# Patient Record
Sex: Female | Born: 1980 | Race: Black or African American | Hispanic: No | Marital: Married | Smoking: Never smoker
Health system: Southern US, Community
[De-identification: ages and names within clinical notes are randomized; demographics above are authoritative.]

## PROBLEM LIST (undated history)

## (undated) DIAGNOSIS — E079 Disorder of thyroid, unspecified: Secondary | ICD-10-CM

## (undated) DIAGNOSIS — E119 Type 2 diabetes mellitus without complications: Secondary | ICD-10-CM

## (undated) DIAGNOSIS — J45909 Unspecified asthma, uncomplicated: Secondary | ICD-10-CM

## (undated) HISTORY — PX: HERNIA REPAIR: SHX51

## (undated) HISTORY — PX: LASIK: SHX215

## (undated) HISTORY — DX: Unspecified asthma, uncomplicated: J45.909

## (undated) HISTORY — DX: Type 2 diabetes mellitus without complications: E11.9

## (undated) HISTORY — PX: BREAST SURGERY: SHX581

## (undated) HISTORY — DX: Disorder of thyroid, unspecified: E07.9

---

## 2016-09-15 DIAGNOSIS — E032 Hypothyroidism due to medicaments and other exogenous substances: Secondary | ICD-10-CM | POA: Insufficient documentation

## 2016-09-15 DIAGNOSIS — K600 Acute anal fissure: Secondary | ICD-10-CM | POA: Insufficient documentation

## 2016-12-18 DIAGNOSIS — L84 Corns and callosities: Secondary | ICD-10-CM | POA: Insufficient documentation

## 2017-02-17 DIAGNOSIS — N61 Mastitis without abscess: Secondary | ICD-10-CM | POA: Insufficient documentation

## 2017-02-17 DIAGNOSIS — B354 Tinea corporis: Secondary | ICD-10-CM | POA: Insufficient documentation

## 2017-03-09 DIAGNOSIS — L603 Nail dystrophy: Secondary | ICD-10-CM | POA: Insufficient documentation

## 2018-07-19 DIAGNOSIS — G8929 Other chronic pain: Secondary | ICD-10-CM | POA: Insufficient documentation

## 2018-07-19 DIAGNOSIS — J4520 Mild intermittent asthma, uncomplicated: Secondary | ICD-10-CM | POA: Insufficient documentation

## 2018-08-06 DIAGNOSIS — D500 Iron deficiency anemia secondary to blood loss (chronic): Secondary | ICD-10-CM | POA: Insufficient documentation

## 2018-11-19 LAB — CBC AND DIFFERENTIAL
HCT: 35 — AB (ref 36–46)
Hemoglobin: 11.4 — AB (ref 12.0–16.0)
PLATELETS: 264 (ref 150–399)
WBC: 5

## 2018-12-03 ENCOUNTER — Other Ambulatory Visit: Payer: Self-pay

## 2018-12-14 ENCOUNTER — Encounter: Payer: Self-pay | Admitting: Certified Nurse Midwife

## 2018-12-14 LAB — HM PAP SMEAR

## 2018-12-15 ENCOUNTER — Encounter: Payer: Self-pay | Admitting: Certified Nurse Midwife

## 2018-12-15 ENCOUNTER — Ambulatory Visit (INDEPENDENT_AMBULATORY_CARE_PROVIDER_SITE_OTHER): Admitting: Certified Nurse Midwife

## 2018-12-15 VITALS — BP 129/80 | HR 64 | Ht 64.0 in | Wt 171.1 lb

## 2018-12-15 DIAGNOSIS — Z3169 Encounter for other general counseling and advice on procreation: Secondary | ICD-10-CM | POA: Diagnosis not present

## 2018-12-15 NOTE — Patient Instructions (Signed)
Female Infertility    Female infertility refers to a woman's inability to get pregnant (conceive) after a year of having sex regularly (or after 6 months in women over age 40) without using birth control. Infertility can also mean that a woman is not able to carry a pregnancy to full term.  Both women and men can have fertility problems.  What are the causes?  This condition may be caused by:  Problems with reproductive organs. Infertility can result if a woman:  Has an abnormally short cervix or a cervix that does not remain closed during a pregnancy.  Has a blockage or scarring in the fallopian tubes.  Has an abnormally shaped uterus.  Has uterine fibroids. This is a benign mass of tissue or muscle (tumor) that can develop in the uterus.  Is not ovulating in a regular way.  Certain medical conditions. These may include:  Polycystic ovary syndrome (PCOS). This is a hormonal disorder that can cause small cysts to grow on the ovaries. This is the most common cause of infertility in women.  Endometriosis. This is a condition in which the tissue that lines the uterus (endometrium) grows outside of its normal location.  Cancer and cancer treatments, such as chemotherapy or radiation.  Premature ovarian failure. This is when ovaries stop producing eggs and hormones before age 40.  Sexually transmitted diseases, such as chlamydia or gonorrhea.  Autoimmune disorders. These are disorders in which the body's defense system (immune system) attacks normal, healthy cells.  Infertility can be linked to more than one cause. For some women, the cause of infertility is not known (unexplained infertility).  What increases the risk?  Age. A woman's fertility declines with age, especially after her mid-30s.  Being underweight or overweight.  Drinking too much alcohol.  Using drugs such as anabolic steroids, cocaine, and marijuana.  Exercising excessively.  Being exposed to environmental toxins, such as radiation, pesticides, and  certain chemicals.  What are the signs or symptoms?  The main sign of infertility in women is the inability to get pregnant or carry a pregnancy to full term.  How is this diagnosed?  This condition may be diagnosed by:  Checking whether you are ovulating each month. The tests may include:  Blood tests to check hormone levels.  An ultrasound of the ovaries.  Taking a small tissue that lines the uterus and checking it under a microscope (endometrial biopsy).  Doing additional tests. This is done if ovulation is normal. Tests may include:  Hysterosalpingography. This X-ray test can show the shape of the uterus and whether the fallopian tubes are open.  Laparoscopy. This test uses a lighted tube (laparoscope) to look for problems in the fallopian tubes and other organs.  Transvaginal ultrasound. This imaging test is used to check for abnormalities in the uterus and ovaries.  Hysteroscopy. This test uses a lighted tube to check for problems in the cervix and the uterus.  To be diagnosed with infertility, both partners will have a physical exam. Both partners will also have an extensive medical and sexual history taken. Additional tests may be done.  How is this treated?  Treatment depends on the cause of infertility. Most cases of infertility in women are treated with medicine or surgery.  Women may take medicine to:  Correct ovulation problems.  Treat other health conditions.  Surgery may be done to:  Repair damage to the ovaries, fallopian tubes, cervix, or uterus.  Remove growths from the uterus.  Remove scar tissue   from the uterus, pelvis, or other organs.  Assisted reproductive technology (ART)  Assisted reproductive technology (ART) refers to all treatments and procedures that combine eggs and sperm outside the body to try to help a couple conceive. ART is often combined with fertility drugs to stimulate ovulation. Sometimes ART is done using eggs retrieved from another woman's body (donor eggs) or from previously  frozen fertilized eggs (embryos).  There are different types of ART. These include:  Intrauterine insemination (IUI). A long, thin tube is used to place sperm directly into a woman's uterus. This procedure:  Is effective for infertility caused by sperm problems, including low sperm count and low motility.  Can be used in combination with fertility drugs.  In vitro fertilization (IVF). This is done when a woman's fallopian tubes are blocked or when a man has low sperm count. In this procedure:  Fertility drugs are used to stimulate the ovaries to produce multiple eggs.  Once mature, these eggs are removed from the body and combined with the sperm to be fertilized.  The fertilized eggs are then placed into the woman's uterus.  Follow these instructions at home:  Take over-the-counter and prescription medicines only as told by your health care provider.  Do not use any products that contain nicotine or tobacco, such as cigarettes and e-cigarettes. If you need help quitting, ask your health care provider.  If you drink alcohol, limit how much you have to 1 drink a day.  Make dietary changes to lose weight or maintain a healthy weight. Work with your health care provider and a dietitian to set a weight-loss goal that is healthy and reasonable for you.  Seek support from a counselor or support group to talk about your concerns related to infertility. Couples counseling may be helpful for you and your partner.  Practice stress reduction techniques that work well for you, such as regular physical activity, meditation, or deep breathing.  Keep all follow-up visits as told by your health care provider. This is important.  Contact a health care provider if you:  Feel that stress is interfering with your life and relationships.  Have side effects from treatments for infertility.  Summary  Female infertility refers to a woman's inability to get pregnant (conceive) after a year of having sex regularly (or after 6 months in women  over age 40) without using birth control.  To be diagnosed with infertility, both partners will have a physical exam. Both partners will also have an extensive medical and sexual history taken.  Seek support from a counselor or support group to talk about your concerns related to infertility. Couples counseling may be helpful for you and your partner.  This information is not intended to replace advice given to you by your health care provider. Make sure you discuss any questions you have with your health care provider.  Document Released: 11/20/2003 Document Revised: 10/19/2017 Document Reviewed: 10/19/2017  Elsevier Interactive Patient Education © 2019 [employer]  •

## 2018-12-15 NOTE — Progress Notes (Signed)
Subjective:    Jessica Knight is a 38 y.o. female who presents for evaluation of infertility. Patient and partner have been attempting conception for 7 years. She has history of IVF x 1 that was unsuccessful. She states that she has had a complete work up with u/s and blood work completed at infertility clinic in [place]. Her partner had a semen analysis and was found to have low sperm count. He had procedure done to help improve his count with no success. Pregnancies with current partner: no.   Menstrual and Endocrine History LMP Patient's last menstrual period was 11/19/2018 (exact date).  Menarche 16  Shortest interval 28  Longest interval 60  days  Duration of flow 5 days  Heavy menses For 2 days  Clots no  Intermenstrual bleeding no  Postcoital bleeding no  Dysmenorrhea no  Amenorrhea yes for 2 cycles  Weight change no  Hirsutism yes   Balding no  Acne with cycles      Obstetrical History Never pregnant  Gynecologic History Last PAP 08/11/18  Previous abdominal or pelvic surgery no  Pelvic pain no  Endometriosis no  Hot flashes no  DES exposure no  Abnormal Pap no  Cervix Cryo/cone no  Sexually transmitted diseases yes, HSV  Pelvic inflammatory disease no   Infertility and Endocrine Studies Basal body temperature no  Endo with biopsy unsure  Hysterosalpingogram she is unsure  Post-coital test she is unsure  Laparoscopy she is unsure  Hormonal studies yes  Semen analysis yes  Other studies yes  Medications IVF  Other therapies IVF, 1 cycles  Insemination not applicable     Family History Thyroid problems  no  Heart condition or high blood pressure  no  Blood clot or stroke  no  Diabetes  yes  Cancer  no  Birth defects/inherited diseases  no  Infectious diseases (mumps, TB, rubella)  no  Other medical problems  no   Habits Cigarettes:    Wife -  no    Husband - no Alcohol:    Wife -  yes, 1-2 times week    Husband - socially  Marijuana:  Wife - no   Husband - no  The following portions of the patient's history were reviewed and updated as appropriate: allergies, current medications, past family history, past medical history, past social history, past surgical history and problem list.  Review of Systems Pertinent items are noted in HPI.   Paternity of Pregnancies: Number with this partner: 0 Number with other partners: 0 Age of youngest child: not applicable  Urologic History: Infection no  STD yes, HSV  Mumps unknown  Varicocele unknown  Semen analysis yes, low sperm count  Undescended testes unknown  Testicular trauma unknown  Genital surgery yes  Ejaculatory problem unknown  Impotence unknown      Objective:    Female Exam BP 129/80   Pulse 64   Ht 5\' 4"  (1.626 m)   Wt 171 lb 1 oz (77.6 kg)   LMP 11/19/2018 (Exact Date)   BMI 29.36 kg/m  Wt Readings from Last 1 Encounters:  12/15/18 171 lb 1 oz (77.6 kg)   BMI: Body mass index is 29.36 kg/m. No exam performed today, not indicated, referral to infertility specialist .  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   Plan:     referral placed for [facility] due to patients exensive history and age. Pt request to see [facility].  I attest more than 50% of this visit spent reviewing patient history, discussing past procedures and testing completed , and developing a plan of care. Face to face time 10 min.   Doreene Burke, CNM

## 2019-01-25 ENCOUNTER — Telehealth: Payer: Self-pay

## 2019-01-25 ENCOUNTER — Telehealth: Payer: Self-pay | Admitting: Certified Nurse Midwife

## 2019-01-25 NOTE — Telephone Encounter (Signed)
The patient called and stated that she never heard anything back in regards to her last apt w/ Pattricia Boss. The patient stated that she was informed that she would get a call back. Please advise.

## 2019-01-25 NOTE — Telephone Encounter (Signed)
mychart message sent

## 2019-03-08 DIAGNOSIS — Z3181 Encounter for male factor infertility in female patient: Secondary | ICD-10-CM | POA: Insufficient documentation

## 2019-05-22 ENCOUNTER — Encounter: Payer: Self-pay | Admitting: Emergency Medicine

## 2019-05-22 ENCOUNTER — Emergency Department

## 2019-05-22 ENCOUNTER — Emergency Department
Admission: EM | Admit: 2019-05-22 | Discharge: 2019-05-22 | Disposition: A | Discharge status: Home / Self Care | Attending: Emergency Medicine | Admitting: Emergency Medicine

## 2019-05-22 ENCOUNTER — Other Ambulatory Visit: Payer: Self-pay

## 2019-05-22 DIAGNOSIS — R0781 Pleurodynia: Secondary | ICD-10-CM | POA: Insufficient documentation

## 2019-05-22 DIAGNOSIS — Z79899 Other long term (current) drug therapy: Secondary | ICD-10-CM | POA: Insufficient documentation

## 2019-05-22 DIAGNOSIS — J45909 Unspecified asthma, uncomplicated: Secondary | ICD-10-CM | POA: Insufficient documentation

## 2019-05-22 DIAGNOSIS — R079 Chest pain, unspecified: Secondary | ICD-10-CM | POA: Diagnosis present

## 2019-05-22 DIAGNOSIS — R059 Cough, unspecified: Secondary | ICD-10-CM

## 2019-05-22 DIAGNOSIS — R05 Cough: Secondary | ICD-10-CM

## 2019-05-22 LAB — CBC WITH DIFFERENTIAL/PLATELET
Abs Immature Granulocytes: 0.01 10*3/uL (ref 0.00–0.07)
Basophils Absolute: 0 10*3/uL (ref 0.0–0.1)
Basophils Relative: 1 %
Eosinophils Absolute: 0.2 10*3/uL (ref 0.0–0.5)
Eosinophils Relative: 4 %
HCT: 39.3 % (ref 36.0–46.0)
Hemoglobin: 12.6 g/dL (ref 12.0–15.0)
Immature Granulocytes: 0 %
Lymphocytes Relative: 49 %
Lymphs Abs: 2.7 10*3/uL (ref 0.7–4.0)
MCH: 27.8 pg (ref 26.0–34.0)
MCHC: 32.1 g/dL (ref 30.0–36.0)
MCV: 86.8 fL (ref 80.0–100.0)
Monocytes Absolute: 0.3 10*3/uL (ref 0.1–1.0)
Monocytes Relative: 5 %
Neutro Abs: 2.3 10*3/uL (ref 1.7–7.7)
Neutrophils Relative %: 41 %
Platelets: 308 10*3/uL (ref 150–400)
RBC: 4.53 MIL/uL (ref 3.87–5.11)
RDW: 13.7 % (ref 11.5–15.5)
WBC: 5.5 10*3/uL (ref 4.0–10.5)
nRBC: 0 % (ref 0.0–0.2)

## 2019-05-22 LAB — COMPREHENSIVE METABOLIC PANEL
ALT: 14 U/L (ref 0–44)
AST: 21 U/L (ref 15–41)
Albumin: 4.1 g/dL (ref 3.5–5.0)
Alkaline Phosphatase: 65 U/L (ref 38–126)
Anion gap: 6 (ref 5–15)
BUN: 13 mg/dL (ref 6–20)
CO2: 25 mmol/L (ref 22–32)
Calcium: 9.2 mg/dL (ref 8.9–10.3)
Chloride: 105 mmol/L (ref 98–111)
Creatinine, Ser: 0.79 mg/dL (ref 0.44–1.00)
GFR calc Af Amer: >60 mL/min (ref >60)
GFR calc non Af Amer: >60 mL/min (ref >60)
Glucose, Bld: 98 mg/dL (ref 70–99)
Potassium: 4 mmol/L (ref 3.5–5.1)
Sodium: 136 mmol/L (ref 135–145)
Total Bilirubin: 0.6 mg/dL (ref 0.3–1.2)
Total Protein: 7.6 g/dL (ref 6.5–8.1)

## 2019-05-22 LAB — FIBRIN DERIVATIVES D-DIMER (ARMC ONLY): Fibrin derivatives D-dimer (ARMC): 223.84 ng/mL (FEU) (ref 0.00–499.00)

## 2019-05-22 LAB — TROPONIN I: Troponin I: <0.03 ng/mL (ref <0.03)

## 2019-05-22 LAB — POCT PREGNANCY, URINE: Preg Test, Ur: NEGATIVE

## 2019-05-22 MED ORDER — IOHEXOL 350 MG/ML SOLN
75.0000 mL | Freq: Once | INTRAVENOUS | Status: AC | PRN
  Administered 2019-05-22: 75 mL via INTRAVENOUS

## 2019-05-22 MED ORDER — HYDROMORPHONE HCL 1 MG/ML IJ SOLN
0.5000 mg | Freq: Once | INTRAMUSCULAR | Status: AC
  Administered 2019-05-22: 0.5 mg via INTRAVENOUS
  Filled 2019-05-22: qty 1

## 2019-05-22 MED ORDER — ONDANSETRON HCL 4 MG/2ML IJ SOLN
4.0000 mg | Freq: Once | INTRAMUSCULAR | Status: AC
  Administered 2019-05-22: 4 mg via INTRAVENOUS
  Filled 2019-05-22: qty 2

## 2019-05-22 MED ORDER — SODIUM CHLORIDE 0.9% FLUSH
3.0000 mL | Freq: Once | INTRAVENOUS | Status: AC
  Administered 2019-05-22: 3 mL via INTRAVENOUS

## 2019-05-22 MED ORDER — SODIUM CHLORIDE 0.9 % IV SOLN
Freq: Once | INTRAVENOUS | Status: AC
  Administered 2019-05-22: 11:00:00 via INTRAVENOUS

## 2019-05-22 MED ORDER — KETOROLAC TROMETHAMINE 30 MG/ML IJ SOLN
30.0000 mg | Freq: Once | INTRAMUSCULAR | Status: AC
  Administered 2019-05-22: 30 mg via INTRAVENOUS
  Filled 2019-05-22: qty 1

## 2019-05-22 NOTE — ED Notes (Signed)
Pt taken to CT at this time.

## 2019-05-22 NOTE — ED Notes (Signed)
EDP at bedside at this time to update patient. Pt instructed to call for her ride at this time, informed her of 3min med hold after administration of IV medication. Pt states understanding. Will continue to monitor for further patient needs and D/C when patient's ride arrives.

## 2019-05-22 NOTE — ED Triage Notes (Signed)
C/O left chest pain, just under breast.  States pain started last night and worsened today.  Pain worse with C+ DB+ movement.

## 2019-05-22 NOTE — Discharge Instructions (Signed)
Please use Motrin 3 or 4 the over-the-counter pills 3 times a day for pain.  Please follow-up with your regular doctor this coming week.  Have them review your CT report.  They may want to refer you to pulmonary medicine.  Please return for increasing pain shortness of breath fever or any other complaints.

## 2019-05-22 NOTE — ED Notes (Signed)
Pt assisted to the bathroom by this RN.  

## 2019-05-22 NOTE — ED Notes (Signed)
EDP at bedside at this time.  

## 2019-05-22 NOTE — ED Provider Notes (Signed)
[facility] Emergency Department Provider Note   ____________________________________________   First MD Initiated Contact with Patient 05/22/19 1044     (approximate)  I have reviewed the triage vital signs and the nursing notes.   HISTORY  Chief Complaint Chest Pain    HPI Jessica Knight is a [age] y.o. female who complains of sharp pleuritic pain under the left breast onset suddenly last night.  No fever or chills.  She is not short of breath but the pain is much worse with movement or deep breathing.  She is not had this before.  Pain is severe.  Patient has a history of asthma.  Has no fever no cough.         Past Medical History:  Diagnosis Date   Asthma    Thyroid disease     There are no active problems to display for this patient.   Past Surgical History:  Procedure Laterality Date   BREAST SURGERY     HERNIA REPAIR     LASIK      Prior to Admission medications   Medication Sig Start Date End Date Taking? Authorizing Provider  cetirizine (ZYRTEC) 10 MG tablet Take 10 mg by mouth daily. 02/16/19  Yes [provider]  fluticasone (FLONASE) 50 MCG/ACT nasal spray Place 1 spray into both nostrils daily. 02/15/19  Yes [provider]  levothyroxine (SYNTHROID, LEVOTHROID) 112 MCG tablet Take 112 mcg by mouth daily before breakfast.  11/15/18  Yes [provider]  Olopatadine HCl 0.2 % SOLN Place 1 drop into both eyes daily. 02/15/19  Yes [provider]  sodium chloride (OCEAN) 0.65 % nasal spray Place 1 spray into the nose as needed. 02/15/19 02/15/20 Yes [provider]  Albuterol Sulfate 108 (90 Base) MCG/ACT AEPB Inhale into the lungs.    [provider]  meloxicam (MOBIC) 15 MG tablet Take 15 mg by mouth daily.  07/19/18   [provider]  tiZANidine (ZANAFLEX) 4 MG tablet Take 4 mg by mouth at bedtime.  11/19/18 11/19/19  [provider]    Allergies Patient  has no known allergies.  Family History  Problem Relation Age of Onset   Breast cancer Maternal Aunt    Diabetes Maternal Grandmother     Social History Social History   Tobacco Use   Smoking status: Never Smoker   Smokeless tobacco: Never Used  Substance Use Topics   Alcohol use: Yes    Comment: occas   Drug use: Not Currently    Review of Systems  Constitutional: No fever/chills Eyes: No visual changes. ENT: No sore throat. Cardiovascular: Denies chest pain. Respiratory: Denies shortness of breath. Gastrointestinal: No abdominal pain.  No nausea, no vomiting.  No diarrhea.  No constipation. Genitourinary: Negative for dysuria. Musculoskeletal: Negative for back pain. Skin: Negative for rash. Neurological: Negative for headaches, focal weakness  ____________________________________________   PHYSICAL EXAM:  VITAL SIGNS: ED Triage Vitals  Enc Vitals Group     BP 05/22/19 1024 114/82     Pulse Rate 05/22/19 1024 71     Resp 05/22/19 1024 20     Temp 05/22/19 1024 98.8 F (37.1 C)     Temp Source 05/22/19 1024 Oral     SpO2 05/22/19 1024 100 %     Weight 05/22/19 1022 171 lb 1.2 oz (77.6 kg)     Height 05/22/19 1022 5\' 4"  (1.626 m)     Head Circumference --      Peak  Flow --      Pain Score 05/22/19 1021 8     Pain Loc --      Pain Edu? --      Excl. in [place]? --     Constitutional: Alert and oriented.  in acute distress. Eyes: Conjunctivae are normal. Head: Atraumatic. Nose: No congestion/rhinnorhea. Mouth/Throat: Mucous membranes are moist.  Oropharynx non-erythematous. Neck: No stridor.  Cardiovascular: Normal rate, regular rhythm. Grossly normal heart sounds.  Good peripheral circulation. Respiratory: Normal respiratory effort.  No retractions. Lungs CTAB. Gastrointestinal: Soft and nontender. No distention. No abdominal bruits. No CVA tenderness. Musculoskeletal: No lower extremity tenderness nor edema.   Neurologic:  Normal speech and  language. No gross focal neurologic deficits are appreciated Skin:  Skin is warm, dry and intact. No rash noted.   ____________________________________________   LABS (all labs ordered are listed, but only abnormal results are displayed)  Labs Reviewed  TROPONIN I  CBC WITH DIFFERENTIAL/PLATELET  COMPREHENSIVE METABOLIC PANEL  FIBRIN DERIVATIVES D-DIMER ([facility] ONLY)  POCT PREGNANCY, URINE  POC URINE PREG, ED   ____________________________________________  EKG  EKG read interpreted by me shows normal sinus rhythm rate of 63 normal axis there are flipped T waves inferiorly and ST flattening in the precordial leads.  EKG looks somewhat different than previously. ____________________________________________  RADIOLOGY  ED MD interpretation: Chest x-ray read by radiology reviewed by me.  Radiology says no acute disease.  There may be on my interpretation is small line of air around the heart.  This may be a pneumopericardium.  We will get a CT to find out. CT read by radiology reviewed by me shows 2 nodules and some haziness throughout which could be postinfectious or caused by a number of other problems which radiologist has listed.  I reviewed the films. Official radiology report(s): Ct Angio Chest Pe W And/or Wo Contrast  Result Date: 05/22/2019 CLINICAL DATA:  Chest pain for 2 days. EXAM: CT ANGIOGRAPHY CHEST WITH CONTRAST TECHNIQUE: Multidetector CT imaging of the chest was performed using the standard protocol during bolus administration of intravenous contrast. Multiplanar CT image reconstructions and MIPs were obtained to evaluate the vascular anatomy. CONTRAST:  53mL OMNIPAQUE IOHEXOL 350 MG/ML SOLN COMPARISON:  None. FINDINGS: Cardiovascular: The heart is normal in size. No pericardial effusion. The aorta is normal in caliber. No dissection. The pulmonary arteries are mildly enlarged which may suggest pulmonary hypertension. No filling defects are identified to suggest pulmonary  embolism. Mediastinum/Nodes: No mediastinal or hilar mass or lymphadenopathy. The esophagus is grossly normal. Lungs/Pleura: Patchy mosaic pattern of attenuation in the lungs. There are areas of markedly decreased attenuation and paucity of vascularity. This can be seen with both vascular causes and small airways disease. This could be due to constrictive bronchiolitis, reactive airways disease/asthma or bronchiolitis obliterans. Other possibilities would include hypersensitivity pneumonitis and vascular processes such as vasculitis or chronic PE. No pulmonary infiltrates, pulmonary edema or pleural effusions. There is a 7.5 mm nodular density in the left upper lobe just above the major fissure which is indeterminate. There is also 5 mm nodule in the left upper lobe on image number 27. Upper Abdomen: No significant upper abdominal findings. Musculoskeletal: No breast masses, supraclavicular or axillary adenopathy. The bony thorax is unremarkable. No bone lesions or fractures. Review of the MIP images confirms the above findings. IMPRESSION: 1. No CT findings for pulmonary embolism. 2. Normal thoracic aorta. 3. Enlarged pulmonary artery suggesting pulmonary hypertension. 4. No mediastinal or hilar mass or adenopathy. 5. Patchy  mosaic pattern of attenuation in the lungs with areas of marked low-attenuation and hypo vascularity most likely due to small airways disease such as constrictive bronchiolitis, respiratory bronchiolitis, reactive airways disease/asthma. Post infectious processes could also have this appearance along with hypersensitivity pneumonitis and vascular disease. 6. Two left upper lobe pulmonary nodules as detailed above. Recommend follow-up noncontrast chest CT in 6 months to reassess. 7. No significant upper abdominal findings. Electronically Signed   By: Rudie MeyerP.  Gallerani M.D.   On: 05/22/2019 12:24   Dg Chest Port 1 View  Result Date: 05/22/2019 CLINICAL DATA:  Cough EXAM: PORTABLE CHEST 1 VIEW  COMPARISON:  None. FINDINGS: Normal heart size. Normal mediastinal contour. No pneumothorax. No pleural effusion. Lungs appear clear, with no acute consolidative airspace disease and no pulmonary edema. IMPRESSION: No active disease. Electronically Signed   By: Delbert PhenixJason A Poff M.D.   On: 05/22/2019 11:20    ____________________________________________   PROCEDURES  Procedure(s) performed (including Critical Care):  Procedures   ____________________________________________   INITIAL IMPRESSION / ASSESSMENT AND PLAN / ED COURSE  Jessica Savannahattiqua Bonura was evaluated in Emergency Department on 05/22/2019 for the symptoms described in the history of present illness. She was evaluated in the context of the global COVID-19 pandemic, which necessitated consideration that the patient might be at risk for infection with the SARS-CoV-2 virus that causes COVID-19. Institutional protocols and algorithms that pertain to the evaluation of patients at risk for COVID-19 are in a state of rapid change based on information released by regulatory bodies including the CDC and federal and state organizations. These policies and algorithms were followed during the patient's care in the ED.   Patient with no evidence of PE or pericardial air or any other problems.  CT does show some          ____________________________________________   FINAL CLINICAL IMPRESSION(S) / ED DIAGNOSES  Final diagnoses:  Pleuritic chest pain     ED Discharge Orders    None       Note:  This document was prepared using Dragon voice recognition software and may include unintentional dictation errors.    Arnaldo NatalMalinda, Travanti Mcmanus F, MD 05/22/19 [phone]

## 2019-05-22 NOTE — ED Notes (Signed)
NAD Noted at time of D/C. Pt taken to lobby via wheelchair by this RN. D/C into the care of her friend. Pt denies comments/concerns at this time. Fall precautions reviewed with patient at this time. E-sig obtained via paper copy by this RN.

## 2019-05-23 DIAGNOSIS — R918 Other nonspecific abnormal finding of lung field: Secondary | ICD-10-CM | POA: Insufficient documentation

## 2019-05-31 ENCOUNTER — Other Ambulatory Visit: Payer: Self-pay | Admitting: Physician Assistant

## 2019-05-31 DIAGNOSIS — N644 Mastodynia: Secondary | ICD-10-CM

## 2019-06-08 ENCOUNTER — Ambulatory Visit
Admission: RE | Admit: 2019-06-08 | Discharge: 2019-06-08 | Disposition: A | Discharge status: Home / Self Care | Source: Ambulatory Visit | Attending: Physician Assistant | Admitting: Physician Assistant

## 2019-06-08 ENCOUNTER — Ambulatory Visit

## 2019-06-08 ENCOUNTER — Other Ambulatory Visit: Payer: Self-pay

## 2019-06-08 DIAGNOSIS — N644 Mastodynia: Secondary | ICD-10-CM

## 2019-07-30 ENCOUNTER — Emergency Department
Admission: EM | Admit: 2019-07-30 | Discharge: 2019-07-30 | Disposition: A | Discharge status: Home / Self Care | Attending: Emergency Medicine | Admitting: Emergency Medicine

## 2019-07-30 ENCOUNTER — Other Ambulatory Visit: Payer: Self-pay

## 2019-07-30 ENCOUNTER — Emergency Department

## 2019-07-30 ENCOUNTER — Encounter: Payer: Self-pay | Admitting: Emergency Medicine

## 2019-07-30 DIAGNOSIS — J45909 Unspecified asthma, uncomplicated: Secondary | ICD-10-CM | POA: Diagnosis not present

## 2019-07-30 DIAGNOSIS — Y939 Activity, unspecified: Secondary | ICD-10-CM | POA: Insufficient documentation

## 2019-07-30 DIAGNOSIS — Y92410 Unspecified street and highway as the place of occurrence of the external cause: Secondary | ICD-10-CM | POA: Insufficient documentation

## 2019-07-30 DIAGNOSIS — H9201 Otalgia, right ear: Secondary | ICD-10-CM | POA: Insufficient documentation

## 2019-07-30 DIAGNOSIS — H6991 Unspecified Eustachian tube disorder, right ear: Secondary | ICD-10-CM | POA: Insufficient documentation

## 2019-07-30 DIAGNOSIS — Z79899 Other long term (current) drug therapy: Secondary | ICD-10-CM | POA: Diagnosis not present

## 2019-07-30 DIAGNOSIS — G501 Atypical facial pain: Secondary | ICD-10-CM | POA: Diagnosis present

## 2019-07-30 DIAGNOSIS — J341 Cyst and mucocele of nose and nasal sinus: Secondary | ICD-10-CM | POA: Insufficient documentation

## 2019-07-30 DIAGNOSIS — Y999 Unspecified external cause status: Secondary | ICD-10-CM | POA: Diagnosis not present

## 2019-07-30 MED ORDER — TRAMADOL HCL 50 MG PO TABS: 50.0000 mg | ORAL_TABLET | Freq: Four times a day (QID) | ORAL | 0 refills | Status: DC | PRN

## 2019-07-30 MED ORDER — METHYLPREDNISOLONE 4 MG PO TBPK: ORAL_TABLET | ORAL | 0 refills | Status: DC

## 2019-07-30 MED ORDER — NEOMYCIN-POLYMYXIN-HC 3.5-10000-1 OT SOLN: 4.0000 [drp] | Freq: Three times a day (TID) | OTIC | 0 refills | Status: AC

## 2019-07-30 NOTE — Discharge Instructions (Signed)
Follow discharge care instructions and follow-up with ENT for definitive evaluation and treatment.

## 2019-07-30 NOTE — ED Notes (Signed)
Patient transported to CT 
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
## 2019-11-18 ENCOUNTER — Encounter: Attending: Certified Nurse Midwife | Admitting: *Deleted

## 2019-11-18 ENCOUNTER — Other Ambulatory Visit: Payer: Self-pay

## 2019-11-18 ENCOUNTER — Encounter: Payer: Self-pay | Admitting: *Deleted

## 2019-11-18 VITALS — BP 110/78 | Ht 64.0 in | Wt 186.9 lb

## 2019-11-18 DIAGNOSIS — E119 Type 2 diabetes mellitus without complications: Secondary | ICD-10-CM | POA: Diagnosis not present

## 2019-11-18 DIAGNOSIS — O24119 Pre-existing diabetes mellitus, type 2, in pregnancy, unspecified trimester: Secondary | ICD-10-CM | POA: Insufficient documentation

## 2019-11-18 DIAGNOSIS — Z713 Dietary counseling and surveillance: Secondary | ICD-10-CM | POA: Insufficient documentation

## 2019-11-18 NOTE — Progress Notes (Signed)
Diabetes Self-Management Education  Visit Type: First/Initial  Appt. Start Time: 1045 Appt. End Time: 1215  11/18/2019  [name], identified by name and date of birth, is a 38 y.o. female with a diagnosis of Diabetes: Type 2(Pregnant).   ASSESSMENT  Blood pressure 110/78, height 5\' 4"  (1.626 m), weight 186 lb 14.4 oz (84.8 kg). last menstrual period - IVF; estimated date of delivery 05/27/2020 Body mass index is 32.08 kg/m.  Diabetes Self-Management Education - 11/18/19 1339      Visit Information   Visit Type  First/Initial      Initial Visit   Diabetes Type  Type 2   Pregnant   Are you currently following a meal plan?  No    Are you taking your medications as prescribed?  Yes    Date Diagnosed  December 2020      Health Coping   How would you rate your overall health?  Good      Psychosocial Assessment   Patient Belief/Attitude about Diabetes  Motivated to manage diabetes    Self-care barriers  None    Self-management support  Doctor's office;Family    Patient Concerns  Nutrition/Meal planning;Weight Control;Glycemic Control    Special Needs  None    Preferred Learning Style  Visual    Learning Readiness  Change in progress    How often do you need to have someone help you when you read instructions, pamphlets, or other written materials from your doctor or pharmacy?  1 - Never    What is the last grade level you completed in school?  MBA      Pre-Education Assessment   Patient understands the diabetes disease and treatment process.  Needs Instruction    Patient understands incorporating nutritional management into lifestyle.  Needs Instruction    Patient undertands incorporating physical activity into lifestyle.  Needs Instruction    Patient understands using medications safely.  Needs Instruction    Patient understands monitoring blood glucose, interpreting and using results  Needs Instruction    Patient understands prevention, detection, and treatment  of acute complications.  Needs Instruction    Patient understands prevention, detection, and treatment of chronic complications.  Needs Instruction    Patient understands how to develop strategies to address psychosocial issues.  Needs Instruction    Patient understands how to develop strategies to promote health/change behavior.  Needs Instruction      Complications   Last HgB A1C per patient/outside source  6.5 %   11/07/2019   How often do you check your blood sugar?  0 times/day (not testing)   Due to her insurance, she will require the FreeStyle meter and we only have those for demonstration. Instructed her on use. BG in the office on our glucometer was 85 mg/dL at 14/06/2019 pm - 4 1/2 hrs pp.   Have you had a dilated eye exam in the past 12 months?  Yes    Have you had a dental exam in the past 12 months?  Yes    Are you checking your feet?  No      Dietary Intake   Breakfast  oatmeal flax seed, berries; egg white with spinach, peppers and 1 piece of whole wheat toast    Snack (morning)  snacks every2 hours - 1/2 banana and almonds; crackers and nut butter with almond milk; fruit (cantaloupe, kiwi, oranges)    Lunch  chicken breast or salmon; collard greens, peppers, potatoes, rice    Dinner  chicken,  fish or shrimp with potatoes or rice, spinach pasta, peas, beans, corn, Jam[place]ian soup, collard greens, brussels sprouts, rice, okra, tomatoes, carrots, cuccumbers, cabbage, broccoli    Beverage(s)  water, decaf unsweetened tea      Exercise   Exercise Type  Light (walking / raking leaves)    How many days per week to you exercise?  30    How many minutes per day do you exercise?  3    Total minutes per week of exercise  90      Patient Education   Previous Diabetes Education  No    Disease state   Definition of diabetes, type 1 and 2, and the diagnosis of diabetes;Factors that contribute to the development of diabetes    Nutrition management   Role of diet in the treatment of diabetes  and the relationship between the three main macronutrients and blood glucose level;Food label reading, portion sizes and measuring food.;Carbohydrate counting;Reviewed blood glucose goals for pre and post meals and how to evaluate the patients' food intake on their blood glucose level.    Physical activity and exercise   Role of exercise on diabetes management, blood pressure control and cardiac health.    Medications  Other (comment)   Limited use or oral medications during pregnancy and possibility of insuli .   Monitoring  Taught/evaluated SMBG meter.;Purpose and frequency of SMBG.;Taught/discussed recording of test results and interpretation of SMBG.;Identified appropriate SMBG and/or A1C goals.;Ketone testing, when, how.    Chronic complications  Relationship between chronic complications and blood glucose control    Psychosocial adjustment  Identified and addressed patients feelings and concerns about diabetes    Preconception care  Pregnancy and GDM  Role of pre-pregnancy blood glucose control on the development of the fetus;Reviewed with patient blood glucose goals with pregnancy;Role of family planning for patients with diabetes      Individualized Goals (developed by patient)   Reducing Risk  Improve blood sugars Lose weight     Outcomes   Expected Outcomes  Demonstrated interest in learning. Expect positive outcomes    Future DMSE  2 wks       Individualized Plan for Diabetes Self-Management Training:   Learning Objective:  Patient will have a greater understanding of diabetes self-management. Patient education plan is to attend individual and/or group sessions per assessed needs and concerns.   Plan:   Patient Instructions  Read booklet on Gestational Diabetes Follow Gestational Meal Planning Guidelines Limit fruit at breakfast if blood sugars are elevated Complete a 3 Day Food Record and bring to next appointment Check blood sugars 4 x day - before breakfast and 2 hrs  after every meal and record  Bring blood sugar log to all appointments Call MD for prescription for meter, meter strips and lancets Strips FreeStyle Lite Lancets   FreeStyle Purchase urine ketone strips if ordered by MD and check urine ketones every am:  If + increase bedtime snack to 1 protein and 2 carbohydrate servings Walk 20-30 minutes at least 5 x week if permitted by MD  Expected Outcomes:  Demonstrated interest in learning. Expect positive outcomes  Education material provided:  Diabetes Management for Mothers-To- Be Booklet Gestational Meal Planning Guidelines Simple Meal Plan 3 Day Food Record Goals for a Healthy Pregnancy Food Safety  If problems or questions, patient to contact team via:  Johny Drilling, [place], [place], CDE [phone]  Future DSME appointment: 2 wks  December 07, 2019 with the dietitian

## 2019-11-18 NOTE — Patient Instructions (Signed)
Read booklet on Gestational Diabetes Follow Gestational Meal Planning Guidelines Limit fruit at breakfast if blood sugars are elevated Complete a 3 Day Food Record and bring to next appointment Check blood sugars 4 x day - before breakfast and 2 hrs after every meal and record  Bring blood sugar log to all appointments Call MD for prescription for meter, meter strips and lancets Strips FreeStyle Lite Lancets   FreeStyle Purchase urine ketone strips if ordered by MD and check urine ketones every am:  If + increase bedtime snack to 1 protein and 2 carbohydrate servings Walk 20-30 minutes at least 5 x week if permitted by MD

## 2019-11-20 ENCOUNTER — Emergency Department

## 2019-11-20 ENCOUNTER — Other Ambulatory Visit: Payer: Self-pay

## 2019-11-20 ENCOUNTER — Emergency Department
Admission: EM | Admit: 2019-11-20 | Discharge: 2019-11-20 | Disposition: A | Discharge status: Home / Self Care | Attending: Emergency Medicine | Admitting: Emergency Medicine

## 2019-11-20 DIAGNOSIS — O200 Threatened abortion: Secondary | ICD-10-CM | POA: Diagnosis not present

## 2019-11-20 DIAGNOSIS — E119 Type 2 diabetes mellitus without complications: Secondary | ICD-10-CM | POA: Diagnosis not present

## 2019-11-20 DIAGNOSIS — O209 Hemorrhage in early pregnancy, unspecified: Secondary | ICD-10-CM | POA: Diagnosis present

## 2019-11-20 DIAGNOSIS — O4690 Antepartum hemorrhage, unspecified, unspecified trimester: Secondary | ICD-10-CM

## 2019-11-20 DIAGNOSIS — Z79899 Other long term (current) drug therapy: Secondary | ICD-10-CM | POA: Diagnosis not present

## 2019-11-20 DIAGNOSIS — Z3A00 Weeks of gestation of pregnancy not specified: Secondary | ICD-10-CM | POA: Insufficient documentation

## 2019-11-20 DIAGNOSIS — J45909 Unspecified asthma, uncomplicated: Secondary | ICD-10-CM | POA: Diagnosis not present

## 2019-11-20 LAB — WET PREP, GENITAL
Sperm: NONE SEEN
Trich, Wet Prep: NONE SEEN
Yeast Wet Prep HPF POC: NONE SEEN

## 2019-11-20 LAB — CBC
HCT: 36.5 % (ref 36.0–46.0)
Hemoglobin: 11.9 g/dL — ABNORMAL LOW (ref 12.0–15.0)
MCH: 28.2 pg (ref 26.0–34.0)
MCHC: 32.6 g/dL (ref 30.0–36.0)
MCV: 86.5 fL (ref 80.0–100.0)
Platelets: 285 10*3/uL (ref 150–400)
RBC: 4.22 MIL/uL (ref 3.87–5.11)
RDW: 13.5 % (ref 11.5–15.5)
WBC: 7 10*3/uL (ref 4.0–10.5)
nRBC: 0 % (ref 0.0–0.2)

## 2019-11-20 LAB — URINALYSIS, ROUTINE W REFLEX MICROSCOPIC
Bilirubin Urine: NEGATIVE
Glucose, UA: NEGATIVE mg/dL
Hgb urine dipstick: NEGATIVE
Ketones, ur: NEGATIVE mg/dL
Leukocytes,Ua: NEGATIVE
Nitrite: NEGATIVE
Protein, ur: NEGATIVE mg/dL
Specific Gravity, Urine: 1.014 (ref 1.005–1.030)
pH: 5 (ref 5.0–8.0)

## 2019-11-20 LAB — BASIC METABOLIC PANEL
Anion gap: 9 (ref 5–15)
BUN: 10 mg/dL (ref 6–20)
CO2: 22 mmol/L (ref 22–32)
Calcium: 9.3 mg/dL (ref 8.9–10.3)
Chloride: 103 mmol/L (ref 98–111)
Creatinine, Ser: 0.74 mg/dL (ref 0.44–1.00)
GFR calc Af Amer: >60 mL/min (ref >60)
GFR calc non Af Amer: >60 mL/min (ref >60)
Glucose, Bld: 103 mg/dL — ABNORMAL HIGH (ref 70–99)
Potassium: 3.7 mmol/L (ref 3.5–5.1)
Sodium: 134 mmol/L — ABNORMAL LOW (ref 135–145)

## 2019-11-20 LAB — POCT PREGNANCY, URINE: Preg Test, Ur: POSITIVE — AB

## 2019-11-20 LAB — HCG, QUANTITATIVE, PREGNANCY: hCG, Beta Chain, Quant, S: 105960 m[IU]/mL — ABNORMAL HIGH (ref <5)

## 2019-11-20 NOTE — ED Notes (Signed)
This RN at bedside to assist with pelvic exam. EDP Malinda at bedside.

## 2019-11-20 NOTE — ED Notes (Signed)
Pt leaving for imaging.

## 2019-11-20 NOTE — ED Notes (Signed)
Pt sitting calmly in bed. Alert.  

## 2019-11-20 NOTE — ED Notes (Addendum)
Pt reports changes in her menstrual bleeding and inc cramping. Reports couldn't tolerate the pain and felt "off" so she came in. Denies dizziness, HA, vision changes, difficulty breathing, nausea, and weakness. Pt not tender in abdomen. BMs and urination normal per pt.

## 2019-11-20 NOTE — ED Notes (Signed)
Pelvic cart to bedside 

## 2019-11-20 NOTE — ED Triage Notes (Signed)
Pt presents via POV c/o vaginal bleeding since this am. Reports has been through 4 pads. Reports some headache and vaginal cramping.

## 2019-11-20 NOTE — Discharge Instructions (Signed)
I spoke with Dr. Leafy Ro today.  She wants you to call the office in the morning and schedule appointment in the next 2 days.  Anytime you have bleeding in early in pregnancy we call it a threatened miscarriage.  Very often the pregnancy turns out normally.  Other times you may miscarry.  But there is not really anything we can do one way or the other about it at this point.  I would not put anything in the vagina until you are cleared by OB though.  Please return for worse cramping or heavy bleeding.

## 2019-11-20 NOTE — ED Provider Notes (Signed)
[facility] Emergency Department Provider Note   ____________________________________________   First MD Initiated Contact with Patient 11/20/19 1747     (approximate)  I have reviewed the triage vital signs and the nursing notes.   HISTORY  Chief Complaint Vaginal Bleeding   HPI Yehuda Savannahattiqua Stander is a 38 y.o. female patient with some light vaginal bleeding since this morning.  She has been through 4 pads.  She has some cramping in the left side of the lower abdomen.  [facility]  She has had in vitro fertilization for this pregnancy.  It is her first pregnancy.         Past Medical History:  Diagnosis Date  . Asthma   . Diabetes mellitus without complication (HCC)   . Thyroid disease     There are no problems to display for this patient.   Past Surgical History:  Procedure Laterality Date  . BREAST SURGERY    . HERNIA REPAIR    . LASIK      Prior to Admission medications   Medication Sig Start Date End Date Taking? Authorizing Provider  Albuterol Sulfate 108 (90 Base) MCG/ACT AEPB Inhale 2 puffs into the lungs every 6 (six) hours as needed. 05/23/19   [provider]  fluticasone (FLONASE) 50 MCG/ACT nasal spray Place 1 spray into both nostrils daily. 02/15/19   [provider]  levothyroxine (SYNTHROID) 125 MCG tablet Take 125 mcg by mouth daily before breakfast.    [provider]  Prenatal 28-0.8 MG TABS Take 1 tablet by mouth daily.    [provider]  progesterone 50 MG/ML injection Inject 50 mg into the muscle daily. 11/09/19   [provider]    Allergies Patient has no known allergies.  Family History  Problem Relation Age of Onset  . Breast cancer Maternal Aunt   . Diabetes Maternal Grandmother     Social History Social History   Tobacco Use  . Smoking status: Never Smoker  . Smokeless tobacco: Never Used  Substance Use Topics  . Alcohol use: Not  Currently    Comment: occas  . Drug use: Not Currently    Review of Systems  Constitutional: No fever/chills Eyes: No visual changes. ENT: No sore throat. Cardiovascular: Denies chest pain. Respiratory: Denies shortness of breath. Gastrointestinal:  abdominal pain.  No nausea, no vomiting.  No diarrhea.  No constipation. Genitourinary: Negative for dysuria. Musculoskeletal: Negative for back pain. Skin: Negative for rash. Neurological: Negative for headaches, focal weakness   ____________________________________________   PHYSICAL EXAM:  VITAL SIGNS: ED Triage Vitals  Enc Vitals Group     BP 11/20/19 1639 (!) 129/94     Pulse Rate 11/20/19 1520 70     Resp 11/20/19 1520 17     Temp 11/20/19 1520 98.4 F (36.9 C)     Temp Source 11/20/19 1520 Oral     SpO2 11/20/19 1520 100 %     Weight --      Height --      Head Circumference --      Peak Flow --      Pain Score 11/20/19 1601 2     Pain Loc --      Pain Edu? --      Excl. in [place]? --     Constitutional: Alert and oriented. Well appearing and in no acute distress. Eyes: Conjunctivae are normal.  Head: Atraumatic. Nose: No congestion/rhinnorhea. Mouth/Throat: Mucous membranes are moist.  Oropharynx  non-erythematous. Neck: No stridor.   Cardiovascular: Normal rate, regular rhythm. Grossly normal heart sounds.  Good peripheral circulation. Respiratory: Normal respiratory effort.  No retractions. Lungs CTAB. Gastrointestinal: Soft and nontender except for mildly in the left lower pelvis.. No distention. No abdominal bruits. No CVA tenderness. Genitourinary: Normal perineum scant discharge in vagina cervix feels closed.  No cervical motion tenderness or masses. Musculoskeletal: No lower extremity tenderness nor edema.  No joint effusions. Neurologic:  Normal speech and language. No gross focal neurologic deficits are appreciated.  Skin:  Skin is warm, dry and intact. No rash  noted.   ____________________________________________   LABS (all labs ordered are listed, but only abnormal results are displayed)  Labs Reviewed  WET PREP, GENITAL - Abnormal; Notable for the following components:      Result Value   Clue Cells Wet Prep HPF POC PRESENT (*)    WBC, Wet Prep HPF POC RARE (*)    All other components within normal limits  CBC - Abnormal; Notable for the following components:   Hemoglobin 11.9 (*)    All other components within normal limits  BASIC METABOLIC PANEL - Abnormal; Notable for the following components:   Sodium 134 (*)    Glucose, Bld 103 (*)    All other components within normal limits  URINALYSIS, ROUTINE W REFLEX MICROSCOPIC - Abnormal; Notable for the following components:   Color, Urine YELLOW (*)    APPearance CLEAR (*)    All other components within normal limits  HCG, QUANTITATIVE, PREGNANCY - Abnormal; Notable for the following components:   hCG, Beta Chain, Quant, S 105,960 (*)    All other components within normal limits  POCT PREGNANCY, URINE - Abnormal; Notable for the following components:   Preg Test, Ur POSITIVE (*)    All other components within normal limits  ABO/RH   ____________________________________________  EKG   ____________________________________________  RADIOLOGY  ED MD interpretation: Ultrasound read by radiology reviewed by me shows a single live intrauterine pregnancy at 12 weeks 3 days.  Everything appears to be normal as far as can be seen but the ovaries were not seen.  Official radiology report(s): US OB Comp Less 14 Wks  Result Date: 11/20/2019 CLINICAL DATA:  Bleeding in early pregnancy. EXAM: OBSTETRIC <14 WK ULTRASOUND TECHNIQUE: Transabdominal ultrasound was performed for evaluation of the gestation as well as the maternal uterus and adnexal regions. COMPARISON:  None. FINDINGS: Intrauterine gestational sac: Single Yolk sac:  Not Visualized. Embryo:  Visualized. Cardiac Activity:  Visualized. Heart Rate:  bpm CRL:   59.5 mm   12 w 3 d                  [place] EDC: 05/31/2020 Subchorionic hemorrhage:  None visualized. Maternal uterus/adnexae: The ovaries were not visualized. There is no significant free fluid in the patient's pelvis. IMPRESSION: Single live IUP at 12 weeks and 3 days. No maternal abnormality detected, however the ovaries were not visualized. Electronically Signed   By: Constance Holster M.D.   On: 11/20/2019 18:58    ____________________________________________   PROCEDURES  Procedure(s) performed (including Critical Care):  Procedures   ____________________________________________   INITIAL IMPRESSION / ASSESSMENT AND PLAN / ED COURSE  Discussed patient with Dr. Leafy Ro.  They will follow her up in the office in 2 days.  Patient will call to get the appointment.  Patient will return if she is worse.              ____________________________________________  FINAL CLINICAL IMPRESSION(S) / ED DIAGNOSES  Final diagnoses:  Vaginal bleeding in pregnancy  Threatened miscarriage     ED Discharge Orders    None       Note:  This document was prepared using Dragon voice recognition software and may include unintentional dictation errors.    Arnaldo Natal, MD 11/20/19 2024

## 2019-11-20 NOTE — ED Notes (Signed)
Pt signed printed d/c paperwork as topaz froze.  

## 2019-11-21 LAB — ABO/RH: ABO/RH(D): A POS

## 2019-12-02 NOTE — L&D Delivery Note (Addendum)
Delivery Note At 2:57 AM a viable and female child was delivered via Vaginal, Spontaneous (Presentation: vtx/ roa     ).  APGAR:5/8 , ; weight  #6/6.   Placenta status:intact  ,  .  Cord: 3v  with the following complications:tight nuchal cord , clamped and cut   .   Mother pushed well and delivery of head and shoulders without difficulty . BAby received PPV and responded well . Placenta shortly after  With several gushes of blood while Pitocin was administered .  Anesthesia: Epidural Episiotomy: None Lacerations: small 2nd  Suture Repair: 2.0 3.0 vicryl Est. Blood Loss (mL): 1120 measured  Mom to to stay on L+D on Magnesium .  Baby to Couplet care / Skin to Skin.  Jessica Knight 05/20/2020, 3:18 AM

## 2019-12-07 ENCOUNTER — Encounter: Attending: Certified Nurse Midwife | Admitting: Dietician

## 2019-12-07 ENCOUNTER — Encounter: Payer: Self-pay | Admitting: Dietician

## 2019-12-07 ENCOUNTER — Other Ambulatory Visit: Payer: Self-pay

## 2019-12-07 VITALS — BP 110/78 | Ht 64.0 in | Wt 185.6 lb

## 2019-12-07 DIAGNOSIS — E119 Type 2 diabetes mellitus without complications: Secondary | ICD-10-CM | POA: Diagnosis not present

## 2019-12-07 DIAGNOSIS — Z713 Dietary counseling and surveillance: Secondary | ICD-10-CM | POA: Diagnosis not present

## 2019-12-07 DIAGNOSIS — O24119 Pre-existing diabetes mellitus, type 2, in pregnancy, unspecified trimester: Secondary | ICD-10-CM | POA: Insufficient documentation

## 2019-12-07 NOTE — Progress Notes (Signed)
.   Patient's BG record indicates fasting BGs (19 readings) ranging 71-106 + one at 115, and post-meal BGs (51 readings) ranging 70-126 + 3 readings in 140s, usually after eating prunes or drinking prune juice to relieve constipation. . Patient's food diary indicates healthy food choices, high vegetable and fruit intake and inclusion of protein sources regularly. She has been avoiding a bedtime snack in effort to control blood sugars, despite feeling quite hungry.  . Provided basic balanced meal plan, and wrote individualized menus based on patient's food preferences. Encouraged evening snack if hungry and explained this could improve fasting BGs if making healthy choices. . Discussed possible role of iron supplementation (in prenatal vitamins) as well as progesterone injections in promoting constipation. Patient is hoping that when she stops the injections at [redacted] weeks GA, the constipation will improve. . Patient has previously been instructed on food safety during pregnancy, including avoidance of Listeriosis, and limiting mercury from fish.

## 2019-12-07 NOTE — Patient Instructions (Signed)
   OK to check fasting blood sugar just before eating breakfast.   Try a small snack at night before going to sleep; vegetables are great anytime and you can add a piece of bread or starchy veg like corn, peas or potato with low-carb veg like okra or cabbage; or try yogurt, bread and cheese, snack-size bowl of kashi or oatmeal squares cereal, or graham cracker with peanut butter.   Great job making healthy food choices and exercising regularly, keep it up!

## 2020-02-21 ENCOUNTER — Other Ambulatory Visit: Payer: Self-pay

## 2020-02-21 ENCOUNTER — Encounter: Attending: Certified Nurse Midwife | Admitting: *Deleted

## 2020-02-21 ENCOUNTER — Encounter: Payer: Self-pay | Admitting: *Deleted

## 2020-02-21 VITALS — BP 108/70 | Wt 187.7 lb

## 2020-02-21 DIAGNOSIS — E119 Type 2 diabetes mellitus without complications: Secondary | ICD-10-CM | POA: Diagnosis not present

## 2020-02-21 DIAGNOSIS — O24119 Pre-existing diabetes mellitus, type 2, in pregnancy, unspecified trimester: Secondary | ICD-10-CM | POA: Insufficient documentation

## 2020-02-21 DIAGNOSIS — Z713 Dietary counseling and surveillance: Secondary | ICD-10-CM | POA: Insufficient documentation

## 2020-02-21 NOTE — Patient Instructions (Addendum)
  Follow Gestational Meal Planning Guidelines Check blood sugars 4 x day - before breakfast and 2 hrs after every meal and record  Bring blood sugar log to MD appointments  Continue exercise program 30 minutes daily  Carry fast acting glucose and a snack at all times Rotate injection sites Give insulin 30 minutes before before supper  Evening insulin Humulin    N (cloudy)           10  units

## 2020-02-21 NOTE — Progress Notes (Signed)
Diabetes Self-Management Education  Visit Type: Follow-up  Appt. Start Time: 0850 Appt. End Time: 5102  02/21/2020  [name], identified by name and date of birth, is a 39 y.o. female with a diagnosis of Diabetes:  .   ASSESSMENT  Blood pressure 108/70, weight 187 lb 11.2 oz (85.1 kg).last menstrual period - IVF; estimated date of delivery - 05/26/2020;  Body mass index is 32.22 kg/m.  Diabetes Self-Management Education - 02/21/20 1000      Visit Information   Visit Type  Follow-up      Complications   Last HgB A1C per patient/outside source 6.2 01/23/2020   How often do you check your blood sugar?  3-4 times/day    Fasting Blood glucose range (mg/dL)  70-129   FBG's 82-118 mg/dL 11/14 our of range   Postprandial Blood glucose range (mg/dL)  70-129;130-179   pp's 75-150 mg/dL 9/42 out of range     Dietary Intake   Breakfast  3 meals and 3 snacks/day      Exercise   Exercise Type  Light (walking / raking leaves)   walking, yoga, HIIT   How many days per week to you exercise?  7    How many minutes per day do you exercise?  30    Total minutes per week of exercise  210      Patient Education   Disease state   Factors that contribute to the development of diabetes    Nutrition management   Role of diet in the treatment of diabetes and the relationship between the three main macronutrients and blood glucose level;Reviewed blood glucose goals for pre and post meals and how to evaluate the patients' food intake on their blood glucose level.;Meal timing in regards to the patients' current diabetes medication.    Physical activity and exercise   Role of exercise on diabetes management, blood pressure control and cardiac health; Reviewed with patient nutritional and/or medication changes necessary with exercise.    Medications  Taught/reviewed insulin injection, site rotation, insulin storage and needle disposal.;Reviewed patients medication for diabetes, action, purpose,  timing of dose and side effects. She injected 5 units NS to left abdomen subcutaneously without difficulty.    Monitoring  Purpose and frequency of SMBG.;Taught/discussed recording of test results and interpretation of SMBG.;Identified appropriate SMBG and/or A1C goals.    Acute complications  Taught treatment of hypoglycemia - the 15 rule.    Psychosocial adjustment  Identified and addressed patients feelings and concerns about diabetes   fear of needles   Preconception care  Reviewed with patient blood glucose goals with pregnancy      Post-Education Assessment   Patient understands the diabetes disease and treatment process.  Demonstrates understanding / competency    Patient understands incorporating nutritional management into lifestyle.  Demonstrates understanding / competency    Patient undertands incorporating physical activity into lifestyle.  Demonstrates understanding / competency    Patient understands using medications safely.  Demonstrates understanding / competency    Patient understands monitoring blood glucose, interpreting and using results  Demonstrates understanding / competency    Patient understands prevention, detection, and treatment of acute complications.  Demonstrates understanding / competency    Patient understands prevention, detection, and treatment of chronic complications.  Demonstrates understanding / competency    Patient understands how to develop strategies to address psychosocial issues.  Demonstrates understanding / competency    Patient understands how to develop strategies to promote health/change behavior.  Demonstrates understanding / competency  Outcomes   Expected Outcomes  Demonstrated interest in learning. Expect positive outcomes    Program Status  Completed      Subsequent Visit   Since your last visit have you continued or begun to take your medications as prescribed?  Yes    Since your last visit have you had your blood pressure checked?   Yes    Is your most recent blood pressure lower, unchanged, or higher since your last visit?  Unchanged    Since your last visit have you experienced any weight changes?  No change    Since your last visit, are you checking your blood glucose at least once a day?  Yes       Individualized Plan for Diabetes Self-Management Training:   Learning Objective:  Patient will have a greater understanding of diabetes self-management. Patient education plan is to attend individual and/or group sessions per assessed needs and concerns.   Plan:   Patient Instructions   Follow Gestational Meal Planning Guidelines Check blood sugars 4 x day - before breakfast and 2 hrs after every meal and record  Bring blood sugar log to MD appointments Continue exercise program 30 minutes daily Carry fast acting glucose and a snack at all times Rotate injection sites Give insulin 30 minutes before before supper Evening insulin Humulin    N (cloudy)           10  units  Expected Outcomes:  Demonstrated interest in learning. Expect positive outcomes  Education material provided:  Step-by-Step Patient Injection Guide Glucose tablets Symptoms, causes and treatments of Hypoglycemia  If problems or questions, patient to contact team via:  Sharion Settler, RN, CCM, CDCES [phone]  Future DSME appointment: PRN

## 2020-02-22 ENCOUNTER — Telehealth: Payer: Self-pay | Admitting: *Deleted

## 2020-02-22 NOTE — Telephone Encounter (Signed)
Phone call to follow up with patient regarding insulin administration. She reports she was disappointment that her fasting blood sugar was 100 this morning. Discussed that it may take a few days before her fasting blood sugars are lower. She also may need to take N Insulin closer to bedtime to see a lower fasting instead of at supper. She reports injection went well and no symptoms of hypoglycemia. Instructed her to call for any questions.

## 2020-05-07 LAB — OB RESULTS CONSOLE GBS: GBS: NEGATIVE

## 2020-05-18 ENCOUNTER — Other Ambulatory Visit: Payer: Self-pay

## 2020-05-18 ENCOUNTER — Inpatient Hospital Stay
Admission: AD | Admit: 2020-05-18 | Discharge: 2020-05-22 | DRG: 806 | Disposition: A | Discharge status: Home / Self Care | Attending: Obstetrics and Gynecology | Admitting: Obstetrics and Gynecology

## 2020-05-18 DIAGNOSIS — J45909 Unspecified asthma, uncomplicated: Secondary | ICD-10-CM | POA: Diagnosis present

## 2020-05-18 DIAGNOSIS — A6000 Herpesviral infection of urogenital system, unspecified: Secondary | ICD-10-CM | POA: Diagnosis present

## 2020-05-18 DIAGNOSIS — O1404 Mild to moderate pre-eclampsia, complicating childbirth: Principal | ICD-10-CM | POA: Diagnosis present

## 2020-05-18 DIAGNOSIS — O9952 Diseases of the respiratory system complicating childbirth: Secondary | ICD-10-CM | POA: Diagnosis present

## 2020-05-18 DIAGNOSIS — O1490 Unspecified pre-eclampsia, unspecified trimester: Secondary | ICD-10-CM

## 2020-05-18 DIAGNOSIS — D62 Acute posthemorrhagic anemia: Secondary | ICD-10-CM | POA: Diagnosis not present

## 2020-05-18 DIAGNOSIS — Z20822 Contact with and (suspected) exposure to covid-19: Secondary | ICD-10-CM | POA: Diagnosis present

## 2020-05-18 DIAGNOSIS — Z3A38 38 weeks gestation of pregnancy: Secondary | ICD-10-CM

## 2020-05-18 DIAGNOSIS — E039 Hypothyroidism, unspecified: Secondary | ICD-10-CM | POA: Diagnosis present

## 2020-05-18 DIAGNOSIS — O99284 Endocrine, nutritional and metabolic diseases complicating childbirth: Secondary | ICD-10-CM | POA: Diagnosis present

## 2020-05-18 DIAGNOSIS — O9832 Other infections with a predominantly sexual mode of transmission complicating childbirth: Secondary | ICD-10-CM | POA: Diagnosis present

## 2020-05-18 DIAGNOSIS — O24414 Gestational diabetes mellitus in pregnancy, insulin controlled: Secondary | ICD-10-CM | POA: Diagnosis present

## 2020-05-18 DIAGNOSIS — O9081 Anemia of the puerperium: Secondary | ICD-10-CM | POA: Diagnosis not present

## 2020-05-18 DIAGNOSIS — O24424 Gestational diabetes mellitus in childbirth, insulin controlled: Secondary | ICD-10-CM | POA: Diagnosis present

## 2020-05-18 LAB — CBC
HCT: 34 % — ABNORMAL LOW (ref 36.0–46.0)
Hemoglobin: 11.1 g/dL — ABNORMAL LOW (ref 12.0–15.0)
MCH: 28.2 pg (ref 26.0–34.0)
MCHC: 32.6 g/dL (ref 30.0–36.0)
MCV: 86.3 fL (ref 80.0–100.0)
Platelets: 226 10*3/uL (ref 150–400)
RBC: 3.94 MIL/uL (ref 3.87–5.11)
RDW: 16.5 % — ABNORMAL HIGH (ref 11.5–15.5)
WBC: 7.6 10*3/uL (ref 4.0–10.5)
nRBC: 0 % (ref 0.0–0.2)

## 2020-05-18 LAB — COMPREHENSIVE METABOLIC PANEL
ALT: 16 U/L (ref 0–44)
AST: 28 U/L (ref 15–41)
Albumin: 2.8 g/dL — ABNORMAL LOW (ref 3.5–5.0)
Alkaline Phosphatase: 203 U/L — ABNORMAL HIGH (ref 38–126)
Anion gap: 7 (ref 5–15)
BUN: 10 mg/dL (ref 6–20)
CO2: 23 mmol/L (ref 22–32)
Calcium: 8.6 mg/dL — ABNORMAL LOW (ref 8.9–10.3)
Chloride: 106 mmol/L (ref 98–111)
Creatinine, Ser: 0.83 mg/dL (ref 0.44–1.00)
GFR calc Af Amer: >60 mL/min (ref >60)
GFR calc non Af Amer: >60 mL/min (ref >60)
Glucose, Bld: 81 mg/dL (ref 70–99)
Potassium: 4.1 mmol/L (ref 3.5–5.1)
Sodium: 136 mmol/L (ref 135–145)
Total Bilirubin: 0.6 mg/dL (ref 0.3–1.2)
Total Protein: 6.1 g/dL — ABNORMAL LOW (ref 6.5–8.1)

## 2020-05-18 LAB — GLUCOSE, CAPILLARY: Glucose-Capillary: 90 mg/dL (ref 70–99)

## 2020-05-18 LAB — TYPE AND SCREEN
ABO/RH(D): A POS
Antibody Screen: NEGATIVE

## 2020-05-18 LAB — PROTEIN / CREATININE RATIO, URINE
Creatinine, Urine: 33 mg/dL
Protein Creatinine Ratio: 1.42 mg/mg{Cre} — ABNORMAL HIGH (ref 0.00–0.15)
Total Protein, Urine: 47 mg/dL

## 2020-05-18 LAB — SARS CORONAVIRUS 2 BY RT PCR (HOSPITAL ORDER, PERFORMED IN ~~LOC~~ HOSPITAL LAB): SARS Coronavirus 2: NEGATIVE

## 2020-05-18 MED ORDER — LIDOCAINE HCL (PF) 1 % IJ SOLN: 30.0000 mL | INTRAMUSCULAR | Status: DC | PRN

## 2020-05-18 MED ORDER — SOD CITRATE-CITRIC ACID 500-334 MG/5ML PO SOLN: 30.0000 mL | ORAL | Status: DC | PRN

## 2020-05-18 MED ORDER — ACETAMINOPHEN 325 MG PO TABS: 650.0000 mg | ORAL_TABLET | ORAL | Status: DC | PRN

## 2020-05-18 MED ORDER — INSULIN DETEMIR 100 UNIT/ML ~~LOC~~ SOLN
7.0000 [IU] | Freq: Every day | SUBCUTANEOUS | Status: DC
  Administered 2020-05-19: 7 [IU] via SUBCUTANEOUS
  Filled 2020-05-18 (×2): qty 0.07

## 2020-05-18 MED ORDER — ZOLPIDEM TARTRATE 5 MG PO TABS
5.0000 mg | ORAL_TABLET | Freq: Every evening | ORAL | Status: DC | PRN
  Administered 2020-05-18: 5 mg via ORAL
  Filled 2020-05-18: qty 1

## 2020-05-18 MED ORDER — FENTANYL CITRATE (PF) 100 MCG/2ML IJ SOLN: 50.0000 ug | INTRAMUSCULAR | Status: DC | PRN

## 2020-05-18 MED ORDER — ONDANSETRON HCL 4 MG/2ML IJ SOLN
4.0000 mg | Freq: Four times a day (QID) | INTRAMUSCULAR | Status: DC | PRN
  Administered 2020-05-20: 4 mg via INTRAVENOUS
  Filled 2020-05-18: qty 2

## 2020-05-18 MED ORDER — LACTATED RINGERS IV SOLN
500.0000 mL | INTRAVENOUS | Status: DC | PRN
  Administered 2020-05-19: 500 mL via INTRAVENOUS

## 2020-05-18 MED ORDER — LACTATED RINGERS IV SOLN: INTRAVENOUS | Status: DC

## 2020-05-18 MED ORDER — OXYTOCIN-SODIUM CHLORIDE 30-0.9 UT/500ML-% IV SOLN
2.5000 [IU]/h | INTRAVENOUS | Status: DC
  Administered 2020-05-20: 2.5 [IU]/h via INTRAVENOUS

## 2020-05-18 MED ORDER — INSULIN DETEMIR 100 UNIT/ML ~~LOC~~ SOLN
14.0000 [IU] | Freq: Every day | SUBCUTANEOUS | Status: DC
  Administered 2020-05-18: 14 [IU] via SUBCUTANEOUS
  Filled 2020-05-18 (×2): qty 0.14

## 2020-05-18 MED ORDER — OXYTOCIN BOLUS FROM INFUSION
333.0000 mL | Freq: Once | INTRAVENOUS | Status: AC
  Administered 2020-05-20: 333 mL via INTRAVENOUS

## 2020-05-18 NOTE — H&P (Signed)
OB History & Physical   History of Present Illness:  Chief Complaint: sent from office for HTN  HPI:  Jessica Knight is a 39 y.o. G1P0000 female at [redacted]w[redacted]d dated by IVF embryo transfer.  She presents to L&D for elevated BP severe range in office with repeat 140/90s. Reports active FM, denies LOF, having occasional UCs and some bloody show noted after foley balloon placed in office.   Pregnancy Issues: 1. Pre-preg BMI >30 2. GDMA2  Insulin NPH: 7units qAM, 14units qPM 3. Hx HSV, on Valtrex suppression 4. AMA 5. IVF pregnancy  Frozen embryo transfer on 09/08/19 by [facility]  Egg donor - Sister at 24 yo.  MaterniT21 normal  Repeat fetal echo done 03/05/2020, per peds cards note "technically good. There is trivial to mild tricuspid regurgitation. Otherwise normal appearing fetal cardiac anatomy and function." 6. Asthma 7. Hypothyroidism  Synthroid QD 8. Anemia on iron supplement   Maternal Medical History:   Past Medical History:  Diagnosis Date   Asthma    Diabetes mellitus without complication (HCC)    Thyroid disease     Past Surgical History:  Procedure Laterality Date   BREAST SURGERY     HERNIA REPAIR     LASIK      No Known Allergies  Prior to Admission medications   Medication Sig Start Date End Date Taking? Authorizing Provider  Albuterol Sulfate 108 (90 Base) MCG/ACT AEPB Inhale 2 puffs into the lungs every 6 (six) hours as needed. 05/23/19   [provider]  aspirin 81 MG EC tablet Take 81 mg by mouth daily.    [provider]  cetirizine (ZYRTEC) 10 MG tablet Take 10 mg by mouth daily. 08/12/19   [provider]  fluticasone (FLONASE) 50 MCG/ACT nasal spray Place 1 spray into both nostrils daily. 02/15/19   [provider]  FREESTYLE LITE test strip TEST FOUR TIMES DAILY AS DIRECTED 11/18/19   [provider]  HUMULIN N 100 UNIT/ML injection Inject 10 Units into the skin at bedtime. 02/10/20    [provider]  Lancets (FREESTYLE) lancets 4 (four) times daily. as directed 11/18/19   [provider]  levothyroxine (SYNTHROID) 125 MCG tablet Take 125 mcg by mouth daily before breakfast.    [provider]  Prenatal 28-0.8 MG TABS Take 1 tablet by mouth daily.    [provider]     Prenatal care site: [facility]  Social History: She  reports that she has never smoked. She has never used smokeless tobacco. She reports previous alcohol use. She reports previous drug use.  Family History: family history includes Breast cancer in her maternal aunt; Diabetes in her maternal grandmother.   Review of Systems: A full review of systems was performed and negative except as noted in the HPI.     Physical Exam:  Vital Signs: BP 139/87    Pulse 63    Temp 99.6 F (37.6 C) (Oral)    Resp 18    Ht 5\' 4"  (1.626 m)    Wt 93.9 kg    LMP 05/19/2019    BMI 35.53 kg/m  General: no acute distress.  HEENT: normocephalic, atraumatic Heart: regular rate & rhythm.  No murmurs/rubs/gallops Lungs: clear to auscultation bilaterally, normal respiratory effort Abdomen: soft, gravid, non-tender;  EFW: 7lbs Pelvic:   External: Normal external female genitalia  Cervix: loose 1cm  Per exam in office by Dr 05/21/2019, Foley balloon in place.    Extremities:  non-tender, symmetric, No edema bilaterally.  DTRs: 2+  Neurologic: Alert & oriented x 3.    Results for orders placed or performed during the hospital encounter of 05/18/20 (from the past 24 hour(s))  SARS Coronavirus 2 by RT PCR (hospital order, performed in [facility] hospital lab) Nasopharyngeal Nasopharyngeal Swab     Status: None   Collection Time: 05/18/20  5:51 PM   Specimen: Nasopharyngeal Swab  Result Value Ref Range   SARS Coronavirus 2 NEGATIVE NEGATIVE  Protein / creatinine ratio, urine     Status: Abnormal   Collection Time: 05/18/20  6:03 PM  Result Value Ref Range   Creatinine, Urine 33  mg/dL   Total Protein, Urine 47 mg/dL   Protein Creatinine Ratio 1.42 (H) 0.00 - 0.15 mg/mg[Cre]  CBC     Status: Abnormal   Collection Time: 05/18/20  6:33 PM  Result Value Ref Range   WBC 7.6 4.0 - 10.5 K/uL   RBC 3.94 3.87 - 5.11 MIL/uL   Hemoglobin 11.1 (L) 12.0 - 15.0 g/dL   HCT 34.0 (L) 36 - 46 %   MCV 86.3 80.0 - 100.0 fL   MCH 28.2 26.0 - 34.0 pg   MCHC 32.6 30.0 - 36.0 g/dL   RDW 16.5 (H) 11.5 - 15.5 %   Platelets 226 150 - 400 K/uL   nRBC 0.0 0.0 - 0.2 %  Type and screen [place]     Status: None (Preliminary result)   Collection Time: 05/18/20  6:33 PM  Result Value Ref Range   ABO/RH(D) PENDING    Antibody Screen PENDING    Sample Expiration      05/21/2020,2359 Performed at [facility], [address] [place], [address] [address]   Comprehensive metabolic panel     Status: Abnormal   Collection Time: 05/18/20  6:33 PM  Result Value Ref Range   Sodium 136 135 - 145 mmol/L   Potassium 4.1 3.5 - 5.1 mmol/L   Chloride 106 98 - 111 mmol/L   CO2 23 22 - 32 mmol/L   Glucose, Bld 81 70 - 99 mg/dL   BUN 10 6 - 20 mg/dL   Creatinine, Ser 0.83 0.44 - 1.00 mg/dL   Calcium 8.6 (L) 8.9 - 10.3 mg/dL   Total Protein 6.1 (L) 6.5 - 8.1 g/dL   Albumin 2.8 (L) 3.5 - 5.0 g/dL   AST 28 15 - 41 U/L   ALT 16 0 - 44 U/L   Alkaline Phosphatase 203 (H) 38 - 126 U/L   Total Bilirubin 0.6 0.3 - 1.2 mg/dL   GFR calc non Af Amer >60 >60 mL/min   GFR calc Af Amer >60 >60 mL/min   Anion gap 7 5 - 15    Pertinent Results:  Prenatal Labs: Blood type/Rh A pos  Antibody screen neg  Rubella Immune  Varicella Immune  RPR NR  HBsAg Neg  HIV NR  GC neg  Chlamydia neg  Genetic screening negative  1 hour GTT  early A1C 6.5, most recent 05/01/20: 6.6  GBS  negative   FHT: 140bpm, mod variability, + accels, no decels TOCO: q5-28min SVE:  Deferred, Foley balloon in place.    Cephalic by leopolds/confirmed with [place]  No results found.  Assessment:   Jessica Knight is a 39 y.o. G1P0000 female at [redacted]w[redacted]d with GDMA2, Pre-eclampsia  Plan:  1. Admit to Labor & Delivery; consents reviewed and obtained - COVID swab on admit.  - cervical ripening balloon  in place - Pre-e Labs obtained, CBC, CMP and P/C ratio;  will ripen overnight and plan to start active induction tomorrow morning at 0600. - GDMA2, continue diet and home insulin orders: NPH 7units qAM, 14units qPM - Ambien 5mg  PO qHS  2. Fetal Well being  - Fetal Tracing: Cat I tracing - Group B Streptococcus ppx indicated: negative - Presentation: cephalic confirmed by Leopolds and   3. Routine OB: - Prenatal labs reviewed, as above - Rh A pos - CBC, T&S, RPR on admit - Clear fluids, IVF  4. Induction of Labor- new onset HTN and GDMA2 -  Contractions: external toco in place -  Pelvis adequate for TOL -  Plan for induction with foley balloon -  Plan for continuous fetal monitoring  -  Maternal pain control as desired - Anticipate vaginal delivery  5. Post Partum Planning: - Infant feeding: breast - Contraception: TBD - Tdap: 03/07/20  05/07/20, CNM 05/18/20 7:36 PM

## 2020-05-19 ENCOUNTER — Inpatient Hospital Stay: Admitting: Anesthesiology

## 2020-05-19 ENCOUNTER — Encounter: Payer: Self-pay | Admitting: Obstetrics and Gynecology

## 2020-05-19 LAB — GLUCOSE, CAPILLARY
Glucose-Capillary: 69 mg/dL — ABNORMAL LOW (ref 70–99)
Glucose-Capillary: 66 mg/dL — ABNORMAL LOW (ref 70–99)
Glucose-Capillary: 113 mg/dL — ABNORMAL HIGH (ref 70–99)
Glucose-Capillary: 95 mg/dL (ref 70–99)
Glucose-Capillary: 126 mg/dL — ABNORMAL HIGH (ref 70–99)
Glucose-Capillary: 66 mg/dL — ABNORMAL LOW (ref 70–99)
Glucose-Capillary: 66 mg/dL — ABNORMAL LOW (ref 70–99)
Glucose-Capillary: 145 mg/dL — ABNORMAL HIGH (ref 70–99)
Glucose-Capillary: 66 mg/dL — ABNORMAL LOW (ref 70–99)
Glucose-Capillary: 90 mg/dL (ref 70–99)
Glucose-Capillary: 100 mg/dL — ABNORMAL HIGH (ref 70–99)
Glucose-Capillary: 106 mg/dL — ABNORMAL HIGH (ref 70–99)
Glucose-Capillary: 109 mg/dL — ABNORMAL HIGH (ref 70–99)
Glucose-Capillary: 120 mg/dL — ABNORMAL HIGH (ref 70–99)

## 2020-05-19 LAB — CBC
HCT: 33.1 % — ABNORMAL LOW (ref 36.0–46.0)
Hemoglobin: 11.4 g/dL — ABNORMAL LOW (ref 12.0–15.0)
MCH: 28.4 pg (ref 26.0–34.0)
MCHC: 34.4 g/dL (ref 30.0–36.0)
MCV: 82.3 fL (ref 80.0–100.0)
Platelets: 211 10*3/uL (ref 150–400)
RBC: 4.02 MIL/uL (ref 3.87–5.11)
RDW: 16.6 % — ABNORMAL HIGH (ref 11.5–15.5)
WBC: 8.9 10*3/uL (ref 4.0–10.5)
nRBC: 0 % (ref 0.0–0.2)

## 2020-05-19 LAB — RPR: RPR Ser Ql: NONREACTIVE

## 2020-05-19 MED ORDER — LIDOCAINE-EPINEPHRINE (PF) 1.5 %-1:200000 IJ SOLN
INTRAMUSCULAR | Status: DC | PRN
  Administered 2020-05-19: 3 mL via PERINEURAL

## 2020-05-19 MED ORDER — MAGNESIUM SULFATE 40 GM/1000ML IV SOLN
INTRAVENOUS | Status: AC
  Filled 2020-05-19: qty 1000

## 2020-05-19 MED ORDER — LACTATED RINGERS IV SOLN: 500.0000 mL | Freq: Once | INTRAVENOUS | Status: DC

## 2020-05-19 MED ORDER — EPHEDRINE 5 MG/ML INJ: 10.0000 mg | INTRAVENOUS | Status: DC | PRN

## 2020-05-19 MED ORDER — PHENYLEPHRINE 40 MCG/ML (10ML) SYRINGE FOR IV PUSH (FOR BLOOD PRESSURE SUPPORT): 80.0000 ug | PREFILLED_SYRINGE | INTRAVENOUS | Status: DC | PRN

## 2020-05-19 MED ORDER — DEXTROSE 50 % IV SOLN
0.0000 mL | INTRAVENOUS | Status: DC | PRN
  Administered 2020-05-19: 40 mL via INTRAVENOUS
  Administered 2020-05-19: 30 mL via INTRAVENOUS
  Filled 2020-05-19 (×2): qty 50

## 2020-05-19 MED ORDER — OXYTOCIN-SODIUM CHLORIDE 30-0.9 UT/500ML-% IV SOLN: 1.0000 m[IU]/min | INTRAVENOUS | Status: DC

## 2020-05-19 MED ORDER — TERBUTALINE SULFATE 1 MG/ML IJ SOLN: 0.2500 mg | Freq: Once | INTRAMUSCULAR | Status: DC | PRN

## 2020-05-19 MED ORDER — FENTANYL 2.5 MCG/ML W/ROPIVACAINE 0.15% IN NS 100 ML EPIDURAL (ARMC)
EPIDURAL | Status: AC
  Filled 2020-05-19: qty 100

## 2020-05-19 MED ORDER — BUTORPHANOL TARTRATE 1 MG/ML IJ SOLN
2.0000 mg | INTRAMUSCULAR | Status: DC | PRN
  Filled 2020-05-19: qty 2

## 2020-05-19 MED ORDER — CALCIUM GLUCONATE 10 % IV SOLN
INTRAVENOUS | Status: AC
  Filled 2020-05-19: qty 10

## 2020-05-19 MED ORDER — LABETALOL HCL 5 MG/ML IV SOLN: 80.0000 mg | INTRAVENOUS | Status: DC | PRN

## 2020-05-19 MED ORDER — MAGNESIUM SULFATE 4 GM/100ML IV SOLN
4.0000 g | Freq: Once | INTRAVENOUS | Status: AC
  Administered 2020-05-19: 4 g via INTRAVENOUS
  Filled 2020-05-19: qty 100

## 2020-05-19 MED ORDER — LABETALOL HCL 5 MG/ML IV SOLN: 40.0000 mg | INTRAVENOUS | Status: DC | PRN

## 2020-05-19 MED ORDER — BUPIVACAINE HCL (PF) 0.25 % IJ SOLN
INTRAMUSCULAR | Status: DC | PRN
  Administered 2020-05-19: 7 mL via EPIDURAL

## 2020-05-19 MED ORDER — DIPHENHYDRAMINE HCL 50 MG/ML IJ SOLN: 12.5000 mg | INTRAMUSCULAR | Status: DC | PRN

## 2020-05-19 MED ORDER — MISOPROSTOL 25 MCG QUARTER TABLET
25.0000 ug | ORAL_TABLET | ORAL | Status: DC | PRN
  Administered 2020-05-19: 25 ug via BUCCAL
  Filled 2020-05-19: qty 1

## 2020-05-19 MED ORDER — HYDRALAZINE HCL 20 MG/ML IJ SOLN: 10.0000 mg | INTRAMUSCULAR | Status: DC | PRN

## 2020-05-19 MED ORDER — MAGNESIUM SULFATE 2 GM/50ML IV SOLN
2.0000 g | Freq: Once | INTRAVENOUS | Status: DC
  Filled 2020-05-19: qty 50

## 2020-05-19 MED ORDER — LIDOCAINE HCL (PF) 1 % IJ SOLN
INTRAMUSCULAR | Status: DC | PRN
  Administered 2020-05-19: 3 mL

## 2020-05-19 MED ORDER — MISOPROSTOL 25 MCG QUARTER TABLET
25.0000 ug | ORAL_TABLET | ORAL | Status: DC | PRN
  Administered 2020-05-19: 25 ug via VAGINAL
  Filled 2020-05-19: qty 1

## 2020-05-19 MED ORDER — LEVOTHYROXINE SODIUM 125 MCG PO TABS
125.0000 ug | ORAL_TABLET | Freq: Every day | ORAL | Status: DC
  Administered 2020-05-19: 125 ug via ORAL
  Filled 2020-05-19 (×2): qty 1

## 2020-05-19 MED ORDER — FENTANYL 2.5 MCG/ML W/ROPIVACAINE 0.15% IN NS 100 ML EPIDURAL (ARMC)
12.0000 mL/h | EPIDURAL | Status: DC
  Administered 2020-05-20: 12 mL/h via EPIDURAL
  Filled 2020-05-19: qty 100

## 2020-05-19 MED ORDER — OXYTOCIN-SODIUM CHLORIDE 30-0.9 UT/500ML-% IV SOLN
1.0000 m[IU]/min | INTRAVENOUS | Status: DC
  Administered 2020-05-19: 2 m[IU]/min via INTRAVENOUS
  Filled 2020-05-19: qty 1000

## 2020-05-19 MED ORDER — FENTANYL 2.5 MCG/ML W/ROPIVACAINE 0.15% IN NS 100 ML EPIDURAL (ARMC)
EPIDURAL | Status: DC | PRN
  Administered 2020-05-19: 12 mL/h via EPIDURAL

## 2020-05-19 MED ORDER — OXYTOCIN-SODIUM CHLORIDE 30-0.9 UT/500ML-% IV SOLN
1.0000 m[IU]/min | INTRAVENOUS | Status: DC
  Administered 2020-05-19: 12 m[IU]/min via INTRAVENOUS

## 2020-05-19 MED ORDER — DEXTROSE-NACL 5-0.45 % IV SOLN: INTRAVENOUS | Status: DC

## 2020-05-19 MED ORDER — LABETALOL HCL 5 MG/ML IV SOLN
20.0000 mg | INTRAVENOUS | Status: DC | PRN
  Administered 2020-05-19: 20 mg via INTRAVENOUS
  Filled 2020-05-19: qty 4

## 2020-05-19 MED ORDER — INSULIN REGULAR(HUMAN) IN NACL 100-0.9 UT/100ML-% IV SOLN
INTRAVENOUS | Status: DC
  Administered 2020-05-19: 1.1 [IU]/h via INTRAVENOUS
  Filled 2020-05-19: qty 100

## 2020-05-19 MED ORDER — SODIUM CHLORIDE 0.9 % IV SOLN: INTRAVENOUS | Status: DC

## 2020-05-19 MED ORDER — MAGNESIUM SULFATE 40 GM/1000ML IV SOLN
2.0000 g/h | INTRAVENOUS | Status: DC
  Administered 2020-05-19: 2 g/h via INTRAVENOUS
  Filled 2020-05-19: qty 1000

## 2020-05-19 NOTE — Anesthesia Procedure Notes (Signed)
Epidural Patient location during procedure: OB Start time: 05/19/2020 5:50 PM End time: 05/19/2020 6:04 PM  Staffing Anesthesiologist: Yves Dill, MD Performed: anesthesiologist   Preanesthetic Checklist Completed: patient identified, IV checked, site marked, risks and benefits discussed, surgical consent, monitors and equipment checked, pre-op evaluation and timeout performed  Epidural Patient position: sitting Prep: Betadine Patient monitoring: heart rate, continuous pulse ox and blood pressure Approach: midline Location: L3-L4 Injection technique: LOR air  Needle:  Needle type: Tuohy  Needle gauge: 17 G Needle length: 9 cm and 9 Catheter type: closed end flexible Catheter size: 19 Gauge Test dose: negative and 1.5% lidocaine with Epi 1:200 K  Assessment Events: blood not aspirated, injection not painful, no injection resistance, no paresthesia and negative IV test  Additional Notes Time out called.  Patient placed in sitting position.  Back prepped and draped in sterile fashion.  A skin wheal was made in the L3-L4 interspace with 1% Lidocaine plain.  A 17 G Tuohy needle was advanced into the epidural space by a loss of resistance technique.  The epidural catheter was advanced 3cm into the space and the TD was negative.  The patient tolerated the procedure well and the catheter was affixed to the back in sterile fashion.Reason for block:procedure for pain

## 2020-05-19 NOTE — Progress Notes (Signed)
Jessica Knight is a 39 y.o. G1P0000 at [redacted]w[redacted]d Pitocin at 10 mu/min   CLe jsu palced  On MAgnesium  Glucose stable  Subjective:    Objective: BP (!) 148/109   Pulse 76   Temp 98.9 F (37.2 C)   Resp 18   Ht 5\' 4"  (1.626 m)   Wt 93.9 kg   LMP 05/19/2019   BMI 35.53 kg/m  I/O last 3 completed shifts: In: 500 [I.V.:500] Out: -  Total I/O In: 1396 [P.O.:800; I.V.:379.4; IV Piggyback:216.7] Out: 1400 [Urine:1400]  FHT:  FHR: 130 bpm, variability: moderate,  accelerations:  Present,  decelerations:  Absent UC:   regular, every 3-4 minutes SVE:   Dilation: 4.5 Effacement (%): 90 Station: 0 Exam by:: Jessica Niblack MD  cervix unchanged  From Arom this am  Labs: Lab Results  Component Value Date   WBC 8.9 05/19/2020   HGB 11.4 (L) 05/19/2020   HCT 33.1 (L) 05/19/2020   MCV 82.3 05/19/2020   PLT 211 05/19/2020    Assessment / Plan: Protracted early active labor - no change in 8 hours . IUPC placed . Continue increasing Pitocin to attain adequate ctx pattern   Pt is aware if not changing cx I will recommend cesarean section  05/21/2020 Jessica Knight 05/19/2020, 6:22 PM

## 2020-05-19 NOTE — Progress Notes (Signed)
Jessica Knight is a 39 y.o. G1P0000 at [redacted]w[redacted]d by Subjective: Induction for elevated bp yesterday. + P/Cr =1.42 BP since admission 130's/ 80's  A2 GDM on Levemir 14 last pm and 7 this am ate breakfast  Last glucose 66at 0900 Cytotec at 0600  No cns symtoms   Objective: BP 138/88   Pulse 62   Temp 97.8 F (36.6 C) (Oral)   Resp 18   Ht 5\' 4"  (1.626 m)   Wt 93.9 kg   LMP 05/19/2019   BMI 35.53 kg/m  I/O last 3 completed shifts: In: 500 [I.V.:500] Out: -  No intake/output data recorded.  FHT:  FHR: 150 bpm, variability: moderate,  accelerations:  Present,  decelerations:  Absent UC:   regular, every 4-5 minutes cx AROM 4-5 cm / 90 / 0  VTX   Labs: Lab Results  Component Value Date   WBC 7.6 05/18/2020   HGB 11.1 (L) 05/18/2020   HCT 34.0 (L) 05/18/2020   MCV 86.3 05/18/2020   PLT 226 05/18/2020    Assessment / Plan: Induction for elevated BP and proteinuria . BP currently < 140/90 and no CNS symptoms Will hold on magnesium prophylaxis . If BP  Elevates I will start MAgnesium  Endotool started for glucose control  Will start Pitocin in 1 hr if not adequate ctx pattern  Stadol 1-2 mg  Prn ,  CLE prn   05/20/2020 Jessica Knight 05/19/2020, 9:37 AM

## 2020-05-19 NOTE — Progress Notes (Signed)
Labor Progress Note  Jessica Knight is a 39 y.o. G1P0000 at [redacted]w[redacted]d by IVF admitted for induction of labor due to Gestational diabetes and Hypertension.  Subjective: leaking from foley balloon, dark red bloody show noted and lost mucus plug earlier.   Objective: BP 138/88   Pulse 62   Temp 98.5 F (36.9 C) (Oral)   Resp 18   Ht 5\' 4"  (1.626 m)   Wt 93.9 kg   LMP 05/19/2019   BMI 35.53 kg/m  Notable VS details: reviewed, mild range to normal since admission.   Fetal Assessment: FHT:  FHR: 130 bpm, variability: moderate,  accelerations:  Present,  decelerations:  Absent Category/reactivity:  Category I UC:   irregular, every 6-10 minutes SVE:   3/50/-2, foley balloon in vaginal vault, removed intact, dark brown discharge noted.  Membrane status: intact Amniotic color: n/a  Labs: Lab Results  Component Value Date   WBC 7.6 05/18/2020   HGB 11.1 (L) 05/18/2020   HCT 34.0 (L) 05/18/2020   MCV 86.3 05/18/2020   PLT 226 05/18/2020    Assessment / Plan:  G1 at 39.0wks, IOL for Pre-eclampsia without severe features, GDMA2.   Labor: s/p outpatient foley balloon, cytotec now buccal and vaginal  Preeclampsia:  Labs done on admission WNL, P/C ratio 1.42, no sx.  Fetal Wellbeing:  Category I Pain Control:  Labor support without medications I/D:  n/a Anticipated MOD:  NSVD  05/20/2020, CNM 05/19/2020, 6:24 AM

## 2020-05-19 NOTE — Progress Notes (Signed)
Discussed POC with Dr. Feliberto Gottron, MD. Plan to continue leaving insulin drip off (last CBG 90) despite endotool instructions. Will continue to check CBGs as directed by endotool. Discussed severe range pressure with no need to treat since we have not had two consecutive per labetolol protocol. Discussed pitocin titration and cervical exam. Provider wishes for CNM to place IUPC.

## 2020-05-19 NOTE — Anesthesia Preprocedure Evaluation (Signed)
Anesthesia Evaluation  Patient identified by MRN, date of birth, ID band Patient awake    Reviewed: Allergy & Precautions, NPO status , Patient's Chart, lab work & pertinent test results  Airway Mallampati: II  TM Distance: >3 FB     Dental   Pulmonary asthma ,    Pulmonary exam normal        Cardiovascular hypertension, Normal cardiovascular exam     Neuro/Psych negative neurological ROS  negative psych ROS   GI/Hepatic negative GI ROS, Neg liver ROS,   Endo/Other  diabetesHypothyroidism   Renal/GU negative Renal ROS  negative genitourinary   Musculoskeletal negative musculoskeletal ROS (+)   Abdominal Normal abdominal exam  (+)   Peds negative pediatric ROS (+)  Hematology negative hematology ROS (+)   Anesthesia Other Findings Past Medical History: No date: Asthma No date: Diabetes mellitus without complication (HCC) No date: Thyroid disease  Reproductive/Obstetrics (+) Pregnancy                             Anesthesia Physical Anesthesia Plan  ASA: III  Anesthesia Plan: Epidural   Post-op Pain Management:    Induction:   PONV Risk Score and Plan:   Airway Management Planned: Natural Airway  Additional Equipment:   Intra-op Plan:   Post-operative Plan:   Informed Consent: I have reviewed the patients History and Physical, chart, labs and discussed the procedure including the risks, benefits and alternatives for the proposed anesthesia with the patient or authorized representative who has indicated his/her understanding and acceptance.     Dental advisory given  Plan Discussed with: CRNA and Surgeon  Anesthesia Plan Comments:         Anesthesia Quick Evaluation

## 2020-05-19 NOTE — Progress Notes (Signed)
Jessica Knight is a 39 y.o. G1P0000 at [redacted]w[redacted]d  Subjective:  Cle working well  Pit at 22 mu/min  Objective: BP 135/73    Pulse 70    Temp 98.6 F (37 C) (Oral)    Resp 16    Ht 5\' 4"  (1.626 m)    Wt 93.9 kg    LMP 05/19/2019    SpO2 94%    BMI 35.53 kg/m  I/O last 3 completed shifts: In: 2230.1 [P.O.:825; I.V.:1188.4; IV Piggyback:216.7] Out: 1530 [Urine:1530] Total I/O In: 75 [I.V.:75] Out: 150 [Urine:150]  FHT:  FHR: 130's bpm, variability: moderate,  accelerations:  Present,  decelerations:  Absent UC:   regular, every 3 minutes SVE: 5 /c/+1 small caput Labs: Lab Results  Component Value Date   WBC 8.9 05/19/2020   HGB 11.4 (L) 05/19/2020   HCT 33.1 (L) 05/19/2020   MCV 82.3 05/19/2020   PLT 211 05/19/2020    Assessment / Plan: small change from last exam . Adequate ctx pattern   Re check in 1 hr for change 05/21/2020 Jessica Knight 05/19/2020, 8:33 PM

## 2020-05-19 NOTE — Progress Notes (Signed)
Rosine Solecki is a 39 y.o. G1P0000 at [redacted]w[redacted]d by  Subjective: Ctx getting more painful  Pitocin at 38mu/min with ctx pattern q 4 min BP increasing with some borderline severe range  BS up and down -on endotool for control  Objective: BP (!) 159/103   Pulse (!) 54   Temp 99 F (37.2 C) (Oral)   Resp 18   Ht 5\' 4"  (1.626 m)   Wt 93.9 kg   LMP 05/19/2019   BMI 35.53 kg/m  I/O last 3 completed shifts: In: 500 [I.V.:500] Out: -  Total I/O In: 480 [P.O.:480] Out: 400 [Urine:400]  FHT:  150 + accels , good variability and no decels except for a few mild variable decels  SVE:   Last checked by me 4.5 / 90 /0   Labs: Lab Results  Component Value Date   WBC 7.6 05/18/2020   HGB 11.1 (L) 05/18/2020   HCT 34.0 (L) 05/18/2020   MCV 86.3 05/18/2020   PLT 226 05/18/2020    Assessment / Plan: Worsening preeclampsia , now with elevation in bp  Start magnesium sulfate prophylaxis . Foley placement to monitor fluids Continue to monitor glucose per endotool  Continue to increase pitocin to attain adequate ctx pattern     05/20/2020 Belissa Kooy 05/19/2020, 1:42 PM

## 2020-05-19 NOTE — Progress Notes (Addendum)
Inpatient Diabetes Program Recommendations  AACE/ADA: New Consensus Statement on Inpatient Glycemic Control (2015)  Target Ranges:  Prepandial:   less than 140 mg/dL      Peak postprandial:   less than 180 mg/dL (1-2 hours)      Critically ill patients:  140 - 180 mg/dL   Lab Results  Component Value Date   GLUCAP 113 (H) 05/19/2020    Review of Glycemic Control Results for MIRAKLE, TOMLIN (MRN [id number]) as of 05/19/2020 11:14  Ref. Range 05/19/2020 05:51 05/19/2020 09:03 05/19/2020 10:47  Glucose-Capillary Latest Ref Range: 70 - 99 mg/dL 69 (L) 66 (L) 409 (H)   Diabetes history: GDM Outpatient Diabetes medications: Humulin N 10 units QHS Current orders for Inpatient glycemic control: IV insulin, NPH 7 units QAM  Inpatient Diabetes Program Recommendations:    Noted consult and current orders. In agreement. Would consider discontinuing NPH orders. Following.   Thanks, Lujean Rave, MSN, RNC-OB Diabetes Coordinator [phone] (8a-5p)

## 2020-05-19 NOTE — Progress Notes (Signed)
cx now 6 cm / c/ +1 station   Reassuring fetal monitoring  Cont Pitocin

## 2020-05-20 DIAGNOSIS — O1490 Unspecified pre-eclampsia, unspecified trimester: Secondary | ICD-10-CM

## 2020-05-20 LAB — COMPREHENSIVE METABOLIC PANEL
ALT: 16 U/L (ref 0–44)
AST: 39 U/L (ref 15–41)
Albumin: 2.5 g/dL — ABNORMAL LOW (ref 3.5–5.0)
Alkaline Phosphatase: 177 U/L — ABNORMAL HIGH (ref 38–126)
Anion gap: 10 (ref 5–15)
BUN: 9 mg/dL (ref 6–20)
CO2: 18 mmol/L — ABNORMAL LOW (ref 22–32)
Calcium: 7.3 mg/dL — ABNORMAL LOW (ref 8.9–10.3)
Chloride: 98 mmol/L (ref 98–111)
Creatinine, Ser: 0.94 mg/dL (ref 0.44–1.00)
GFR calc Af Amer: >60 mL/min (ref >60)
GFR calc non Af Amer: >60 mL/min (ref >60)
Glucose, Bld: 125 mg/dL — ABNORMAL HIGH (ref 70–99)
Potassium: 4.1 mmol/L (ref 3.5–5.1)
Sodium: 126 mmol/L — ABNORMAL LOW (ref 135–145)
Total Bilirubin: 0.5 mg/dL (ref 0.3–1.2)
Total Protein: 5.4 g/dL — ABNORMAL LOW (ref 6.5–8.1)

## 2020-05-20 LAB — CBC
HCT: 29.7 % — ABNORMAL LOW (ref 36.0–46.0)
Hemoglobin: 10.1 g/dL — ABNORMAL LOW (ref 12.0–15.0)
MCH: 27.8 pg (ref 26.0–34.0)
MCHC: 34 g/dL (ref 30.0–36.0)
MCV: 81.8 fL (ref 80.0–100.0)
Platelets: 207 10*3/uL (ref 150–400)
RBC: 3.63 MIL/uL — ABNORMAL LOW (ref 3.87–5.11)
RDW: 16.7 % — ABNORMAL HIGH (ref 11.5–15.5)
WBC: 18.2 10*3/uL — ABNORMAL HIGH (ref 4.0–10.5)
nRBC: 0 % (ref 0.0–0.2)

## 2020-05-20 LAB — GLUCOSE, CAPILLARY
Glucose-Capillary: 107 mg/dL — ABNORMAL HIGH (ref 70–99)
Glucose-Capillary: 110 mg/dL — ABNORMAL HIGH (ref 70–99)
Glucose-Capillary: 111 mg/dL — ABNORMAL HIGH (ref 70–99)
Glucose-Capillary: 113 mg/dL — ABNORMAL HIGH (ref 70–99)
Glucose-Capillary: 124 mg/dL — ABNORMAL HIGH (ref 70–99)

## 2020-05-20 LAB — MAGNESIUM: Magnesium: 5.8 mg/dL — ABNORMAL HIGH (ref 1.7–2.4)

## 2020-05-20 MED ORDER — SENNOSIDES-DOCUSATE SODIUM 8.6-50 MG PO TABS
2.0000 | ORAL_TABLET | ORAL | Status: DC
  Administered 2020-05-21 – 2020-05-22 (×2): 2 via ORAL
  Filled 2020-05-20 (×2): qty 2

## 2020-05-20 MED ORDER — LABETALOL HCL 200 MG PO TABS
200.0000 mg | ORAL_TABLET | Freq: Two times a day (BID) | ORAL | Status: DC
  Administered 2020-05-20 – 2020-05-22 (×5): 200 mg via ORAL
  Filled 2020-05-20: qty 2
  Filled 2020-05-20: qty 1
  Filled 2020-05-20: qty 2
  Filled 2020-05-20 (×3): qty 1

## 2020-05-20 MED ORDER — BENZOCAINE-MENTHOL 20-0.5 % EX AERO
1.0000 "application " | INHALATION_SPRAY | CUTANEOUS | Status: DC | PRN
  Administered 2020-05-20 (×2): 1 via TOPICAL
  Filled 2020-05-20: qty 56

## 2020-05-20 MED ORDER — PRENATAL MULTIVITAMIN CH
1.0000 | ORAL_TABLET | Freq: Every day | ORAL | Status: DC
  Administered 2020-05-20 – 2020-05-22 (×3): 1 via ORAL
  Filled 2020-05-20 (×3): qty 1

## 2020-05-20 MED ORDER — OXYCODONE HCL 5 MG PO TABS: 5.0000 mg | ORAL_TABLET | ORAL | Status: DC | PRN

## 2020-05-20 MED ORDER — ZOLPIDEM TARTRATE 5 MG PO TABS: 5.0000 mg | ORAL_TABLET | Freq: Every evening | ORAL | Status: DC | PRN

## 2020-05-20 MED ORDER — ONDANSETRON HCL 4 MG/2ML IJ SOLN: 4.0000 mg | INTRAMUSCULAR | Status: DC | PRN

## 2020-05-20 MED ORDER — MAGNESIUM HYDROXIDE 400 MG/5ML PO SUSP
30.0000 mL | ORAL | Status: DC | PRN
  Filled 2020-05-20: qty 30

## 2020-05-20 MED ORDER — MEASLES, MUMPS & RUBELLA VAC IJ SOLR
0.5000 mL | Freq: Once | INTRAMUSCULAR | Status: DC
  Filled 2020-05-20: qty 0.5

## 2020-05-20 MED ORDER — OXYCODONE HCL 5 MG PO TABS: 10.0000 mg | ORAL_TABLET | ORAL | Status: DC | PRN

## 2020-05-20 MED ORDER — ONDANSETRON HCL 4 MG PO TABS: 4.0000 mg | ORAL_TABLET | ORAL | Status: DC | PRN

## 2020-05-20 MED ORDER — DIPHENHYDRAMINE HCL 25 MG PO CAPS: 25.0000 mg | ORAL_CAPSULE | Freq: Four times a day (QID) | ORAL | Status: DC | PRN

## 2020-05-20 MED ORDER — IBUPROFEN 600 MG PO TABS
600.0000 mg | ORAL_TABLET | Freq: Four times a day (QID) | ORAL | Status: DC
  Administered 2020-05-20 – 2020-05-22 (×8): 600 mg via ORAL
  Filled 2020-05-20 (×8): qty 1

## 2020-05-20 MED ORDER — MISOPROSTOL 200 MCG PO TABS
800.0000 ug | ORAL_TABLET | Freq: Once | ORAL | Status: AC
  Administered 2020-05-20: 800 ug via RECTAL

## 2020-05-20 MED ORDER — SIMETHICONE 80 MG PO CHEW: 80.0000 mg | CHEWABLE_TABLET | ORAL | Status: DC | PRN

## 2020-05-20 MED ORDER — LEVOTHYROXINE SODIUM 125 MCG PO TABS
125.0000 ug | ORAL_TABLET | Freq: Every day | ORAL | Status: DC
  Administered 2020-05-20 – 2020-05-22 (×3): 125 ug via ORAL
  Filled 2020-05-20 (×3): qty 1

## 2020-05-20 MED ORDER — ACETAMINOPHEN 325 MG PO TABS: 650.0000 mg | ORAL_TABLET | ORAL | Status: DC | PRN

## 2020-05-20 MED ORDER — WITCH HAZEL-GLYCERIN EX PADS
1.0000 "application " | MEDICATED_PAD | CUTANEOUS | Status: DC | PRN
  Administered 2020-05-20: 1 via TOPICAL
  Filled 2020-05-20: qty 100

## 2020-05-20 MED ORDER — DIBUCAINE (PERIANAL) 1 % EX OINT
1.0000 "application " | TOPICAL_OINTMENT | CUTANEOUS | Status: DC | PRN
  Administered 2020-05-20: 1 via RECTAL
  Filled 2020-05-20: qty 28

## 2020-05-20 MED ORDER — COCONUT OIL OIL: 1.0000 "application " | TOPICAL_OIL | Status: DC | PRN

## 2020-05-20 MED ORDER — FERROUS SULFATE 325 (65 FE) MG PO TABS
325.0000 mg | ORAL_TABLET | Freq: Two times a day (BID) | ORAL | Status: DC
  Administered 2020-05-20 – 2020-05-22 (×5): 325 mg via ORAL
  Filled 2020-05-20 (×5): qty 1

## 2020-05-20 NOTE — Discharge Summary (Signed)
Obstetrical Discharge Summary  Patient Name: Jessica Knight DOB: 04/19/1981 MRN: [id number]  Date of Admission: 05/18/2020 Date of Delivery:05/20/20 at [place] by: Huel Cote MD Date of Discharge: 05/22/2020 Primary OB: [facility]  WUJ:[id number]'W last menstrual period was 05/19/2019. EDC Estimated Date of Delivery: 05/26/20 Gestational Age at Delivery: [redacted]w[redacted]d   Antepartum complications:IVF , AMA, A2gdm  Admitting Diagnosis: preeclampsia Secondary Diagnosis: Patient Active Problem List   Diagnosis Date Noted  . PPH (postpartum hemorrhage) 05/20/2020  . Preeclampsia 05/20/2020  . Gestational diabetes mellitus (GDM) requiring insulin 05/18/2020    Augmentation: AROM, Pitocin and Cytotec Complications: None Intrapartum complications/course: pt was admitted for elevated BP . Admitted . BP reached severe range and she was started on Magnesium . Protracted active phase .SVD with tight nuchal cord . Female  QBL 1120 cc Date of Delivery: 05/22/2020  Delivered By: Huel Cote MD Delivery Type: spontaneous vaginal delivery Anesthesia: epidural Placenta: spontaneous Laceration: second  Episiotomy: none Newborn Data :female  APGARS 5/8 weight 6/6  Postpartum Procedures: none  Edinburgh:  Edinburgh Postnatal Depression Scale Screening Tool 05/20/2020  I have been able to laugh and see the funny side of things. 0  I have looked forward with enjoyment to things. 0  I have blamed myself unnecessarily when things went wrong. 0  I have been anxious or worried for no good reason. 0  I have felt scared or panicky for no good reason. 0  Things have been getting on top of me. 0  I have been so unhappy that I have had difficulty sleeping. 0  I have felt sad or miserable. 1  I have been so unhappy that I have been crying. 0  The thought of harming myself has occurred to me. 0  Edinburgh Postnatal Depression Scale Total 1    Post partum course:  Patient had an  uncomplicated postpartum course.  By time of discharge on PPD#2, her pain was controlled on oral pain medications; she had appropriate lochia and was ambulating, voiding without difficulty and tolerating regular diet.  She was deemed stable for discharge to home.     Discharge Physical Exam:  BP 120/85 (BP Location: Right Arm)   Pulse (!) 57   Temp 97.7 F (36.5 C) (Oral)   Resp 18   Ht 5\' 4"  (1.626 m)   Wt 93.9 kg   LMP 05/19/2019   SpO2 100%   Breastfeeding Unknown   BMI 35.53 kg/m   General: NAD CV: RRR Pulm: CTABL, nl effort ABD: s/nd/nt, fundus firm and below the umbilicus Lochia: moderate DVT Evaluation: LE non-ttp, no evidence of DVT on exam.  Hemoglobin  Date Value Ref Range Status  05/21/2020 8.7 (L) 12.0 - 15.0 g/dL Final   HCT  Date Value Ref Range Status  05/21/2020 25.2 (L) 36 - 46 % Final     Disposition: stable, discharge to home. Baby Feeding: breastmilk Baby Disposition: home with mom  Rh Immune globulin given: n/a Rubella vaccine given: n/a Tdap vaccine given in AP or PP setting: 03/07/2020  Contraception: TBD  Prenatal Labs:   Blood type/Rh A pos  Antibody screen neg  Rubella Immune  Varicella Immune  RPR NR  HBsAg Neg  HIV NR  GC neg  Chlamydia neg  Genetic screening negative  1 hour GTT  early A1C 6.5, most recent 05/01/20: 6.6  GBS  negative    Plan:  Jelissa Espiritu was discharged to home in good condition. Follow-up appointment with delivering provider in 6 weeks.  Discharge Medications: Allergies as of 05/22/2020   No Known Allergies     Medication List    STOP taking these medications   aspirin 81 MG EC tablet   freestyle lancets   FREESTYLE LITE test strip Generic drug: glucose blood   HumuLIN N 100 UNIT/ML injection Generic drug: insulin NPH Human     TAKE these medications   Albuterol Sulfate 108 (90 Base) MCG/ACT Aepb Commonly known as: PROAIR RESPICLICK Inhale 2 puffs into the lungs every 6 (six) hours as  needed.   cetirizine 10 MG tablet Commonly known as: ZYRTEC Take 10 mg by mouth daily.   ferrous sulfate 325 (65 FE) MG tablet Take 1 tablet (325 mg total) by mouth 2 (two) times daily with a meal.   fluticasone 50 MCG/ACT nasal spray Commonly known as: FLONASE Place 1 spray into both nostrils daily.   ibuprofen 600 MG tablet Commonly known as: ADVIL Take 1 tablet (600 mg total) by mouth every 6 (six) hours as needed for mild pain, moderate pain or cramping.   labetalol 200 MG tablet Commonly known as: NORMODYNE Take 1 tablet (200 mg total) by mouth 2 (two) times daily.   levothyroxine 125 MCG tablet Commonly known as: SYNTHROID Take 125 mcg by mouth daily before breakfast.   Prenatal 28-0.8 MG Tabs Take 1 tablet by mouth daily.        Follow-up Information    [facility]. Schedule an appointment as soon as possible for a visit in 3 day(s).   Why: For blood pressure check Contact information: [address] [place] [address] [phone]              Signed: Genia Del, [facility] 05/22/2020 7:44 AM

## 2020-05-20 NOTE — Progress Notes (Signed)
Post Partum Day 0 Subjective: Doing well, no complaints.  Tolerating regular diet, pain with PO meds, voiding and ambulating without difficulty.  No CP SOB Fever,Chills, N/V or leg pain; denies nipple or breast pain, no HA change of vision, RUQ/epigastric pain  - remains in L&D on Mag Sulfate at 2 gram per hour for seizure prophy due to pre-eclampsia.   Objective: BP 137/90   Pulse 73   Temp 98.6 F (37 C) (Oral)   Resp 18   Ht 5\' 4"  (1.626 m)   Wt 93.9 kg   LMP 05/19/2019   SpO2 95%   BMI 35.53 kg/m    Physical Exam:  General: NAD CV: RRR Pulm: nl effort, CTABL Abdomen: soft, NT, BS x 4 Perineum: minimal edema, lacerations hemostatic and repair well approximated Lochia: small Uterine Fundus: fundus firm and 1 fb below umbilicus DVT Evaluation: no cords, ttp LEs   Recent Labs    05/19/20 1703 05/20/20 0833  HGB 11.4* 10.1*  HCT 33.1* 29.7*  WBC 8.9 18.2*  PLT 211 207   Total I/O In: 1658.5 [P.O.:1200; I.V.:458.5] Out: 1400 [Urine:1400]  Assessment/Plan: 39 y.o. G1P0000 postpartum day # 0  - Continue routine PP care - Lactation consult prn.  - Acute blood loss anemia - hemodynamically stable and asymptomatic; start po ferrous sulfate BID with stool softeners' plan repeat CBC in am due to postpartum hemorrhage and elevated WBCs.  - reviewed with Dr 46, normotensive, discussed DC Magnesium at 12hrs PP if diuresis continues.  - Immunization status: all Imms up to date    Disposition: Does not desire Dc home today.     Feliberto Gottron, CNM 05/20/2020  1:39 PM

## 2020-05-20 NOTE — Lactation Note (Signed)
This note was copied from a baby's chart. Lactation Consultation Note  Patient Name: Jessica Knight [id number] Date: 05/20/2020 Reason for consult: Follow-up assessment;Mother's request;Primapara;Term;Other (Comment) (Sleepy - has not had good breast feed since 14:45pm)  LC has assisted mom with several breast feeds since delivery. Mom is GDMA2 getting insulin.  All 3 blood glucoses have been normal on baby. Demonstrated how to hand express and sandwich breast for initial latch.  Mom still needing assistance at times with positioning with pillow support and latching [name] onto the breast.  Once he achieves a deep enough latch he can maintain the latch with strong, rhythmic sucking with occasional swallows.  Mom has a nursing pillow at home.  FOB will bring pillow in to mom when he returns.  [name] nursed for 27 minutes at this feeding.  Information sheet given and reviewed on normal newborn stomach size, feeding cues, supply and demand, adequate intake and output, normal course of lactation and routine newborn feeding patterns.  Lactation Government social research officer with contact numbers given and reviewed.  Lactation name and number written on white board.  Encouraged mom to call with any questions, concerns or assistance. Maternal Data Formula Feeding for Exclusion: No Has patient been taught Hand Expression?: Yes Does the patient have breastfeeding experience prior to this delivery?: No  Feeding Feeding Type: Breast Fed  LATCH Score Latch: Repeated attempts needed to sustain latch, nipple held in mouth throughout feeding, stimulation needed to elicit sucking reflex.  Audible Swallowing: A few with stimulation  Type of Nipple: Everted at rest and after stimulation  Comfort (Breast/Nipple): Soft / non-tender  Hold (Positioning): Assistance needed to correctly position infant at breast and maintain latch.  LATCH Score: 7  Interventions Interventions: Breast feeding basics  reviewed;Assisted with latch;Skin to skin;Breast massage;Hand express;Reverse pressure;Breast compression;Adjust position;Support pillows;Position options  Lactation Tools Discussed/Used WIC Program:  (Med Pay/Assist & [employer] veterans insurance)   Consult Status Consult Status: Follow-up Follow-up type: Call as needed    Louis Meckel 05/20/2020, 9:20 PM

## 2020-05-20 NOTE — Progress Notes (Signed)
Patient ID: Jessica Knight, female   DOB: September 18, 1981, 39 y.o.   MRN: [id number] cx c/c + 3 station  Good pushing effort anticipate SVD shortly

## 2020-05-21 LAB — COMPREHENSIVE METABOLIC PANEL
ALT: 16 U/L (ref 0–44)
AST: 37 U/L (ref 15–41)
Albumin: 2.3 g/dL — ABNORMAL LOW (ref 3.5–5.0)
Alkaline Phosphatase: 146 U/L — ABNORMAL HIGH (ref 38–126)
Anion gap: 5 (ref 5–15)
BUN: 12 mg/dL (ref 6–20)
CO2: 22 mmol/L (ref 22–32)
Calcium: 7.4 mg/dL — ABNORMAL LOW (ref 8.9–10.3)
Chloride: 109 mmol/L (ref 98–111)
Creatinine, Ser: 1 mg/dL (ref 0.44–1.00)
GFR calc Af Amer: >60 mL/min (ref >60)
GFR calc non Af Amer: >60 mL/min (ref >60)
Glucose, Bld: 105 mg/dL — ABNORMAL HIGH (ref 70–99)
Potassium: 3.8 mmol/L (ref 3.5–5.1)
Sodium: 136 mmol/L (ref 135–145)
Total Bilirubin: 0.4 mg/dL (ref 0.3–1.2)
Total Protein: 5.1 g/dL — ABNORMAL LOW (ref 6.5–8.1)

## 2020-05-21 LAB — CBC
HCT: 25.2 % — ABNORMAL LOW (ref 36.0–46.0)
Hemoglobin: 8.7 g/dL — ABNORMAL LOW (ref 12.0–15.0)
MCH: 28.6 pg (ref 26.0–34.0)
MCHC: 34.5 g/dL (ref 30.0–36.0)
MCV: 82.9 fL (ref 80.0–100.0)
Platelets: 198 10*3/uL (ref 150–400)
RBC: 3.04 MIL/uL — ABNORMAL LOW (ref 3.87–5.11)
RDW: 16.9 % — ABNORMAL HIGH (ref 11.5–15.5)
WBC: 14.8 10*3/uL — ABNORMAL HIGH (ref 4.0–10.5)
nRBC: 0 % (ref 0.0–0.2)

## 2020-05-21 NOTE — Anesthesia Postprocedure Evaluation (Cosign Needed)
Anesthesia Post Note  Patient: Horticulturist, commercial  Procedure(s) Performed: AN AD HOC LABOR EPIDURAL  Patient location during evaluation: Mother Baby Anesthesia Type: Epidural Level of consciousness: awake and alert and oriented Pain management: pain level controlled Vital Signs Assessment: post-procedure vital signs reviewed and stable Respiratory status: respiratory function stable Cardiovascular status: stable Postop Assessment: no headache, no backache, patient able to bend at knees, no apparent nausea or vomiting, able to ambulate and adequate PO intake Anesthetic complications: no   No complications documented.   Last Vitals:  Vitals:   05/21/20 0302 05/21/20 0747  BP: 136/77 114/73  Pulse: 71 (!) 59  Resp: 18 20  Temp: 37.1 C 36.9 C  SpO2: 100% 99%    Last Pain:  Vitals:   05/21/20 0747  TempSrc: Oral  PainSc:                  Jessica Knight

## 2020-05-21 NOTE — Lactation Note (Signed)
This note was copied from a baby's chart. Lactation Consultation Note  Patient Name: Jessica Knight [id number] Date: 05/21/2020   Observed mom breast feeding with Jessica Knight latching well with strong, rhythmic sucking and occasional swallows.  Mom feeling more confident about breast feedings.  Bilirubins are borderline high.  Mom can hand express colostrum.  FOB put Jessica Knight skin to skin after breast feed.  Lactation name and number on white board and encouraged to call with any questions, concerns or when assistance needed.  Maternal Data    Feeding Feeding Type: Breast Fed  LATCH Score                   Interventions    Lactation Tools Discussed/Used     Consult Status      Louis Meckel 05/21/2020, 9:33 PM

## 2020-05-21 NOTE — Discharge Instructions (Signed)
Postpartum Care After Vaginal Delivery This sheet gives you information about how to care for yourself from the time you deliver your baby to up to 6-12 weeks after delivery (postpartum period). Your health care provider may also give you more specific instructions. If you have problems or questions, contact your health care provider. Follow these instructions at home: Vaginal bleeding  It is normal to have vaginal bleeding (lochia) after delivery. Wear a sanitary pad for vaginal bleeding and discharge. ? During the first week after delivery, the amount and appearance of lochia is often similar to a menstrual period. ? Over the next few weeks, it will gradually decrease to a dry, yellow-brown discharge. ? For most women, lochia stops completely by 4-6 weeks after delivery. Vaginal bleeding can vary from woman to woman.  Change your sanitary pads frequently. Watch for any changes in your flow, such as: ? A sudden increase in volume. ? A change in color. ? Large blood clots.  If you pass a blood clot from your vagina, save it and call your health care provider to discuss. Do not flush blood clots down the toilet before talking with your health care provider.  Do not use tampons or douches until your health care provider says this is safe.  If you are not breastfeeding, your period should return 6-8 weeks after delivery. If you are feeding your child breast milk only (exclusive breastfeeding), your period may not return until you stop breastfeeding. Perineal care  Keep the area between the vagina and the anus (perineum) clean and dry as told by your health care provider. Use medicated pads and pain-relieving sprays and creams as directed.  If you had a cut in the perineum (episiotomy) or a tear in the vagina, check the area for signs of infection until you are healed. Check for: ? More redness, swelling, or pain. ? Fluid or blood coming from the cut or tear. ? Warmth. ? Pus or a bad  smell.  You may be given a squirt bottle to use instead of wiping to clean the perineum area after you go to the bathroom. As you start healing, you may use the squirt bottle before wiping yourself. Make sure to wipe gently.  To relieve pain caused by an episiotomy, a tear in the vagina, or swollen veins in the anus (hemorrhoids), try taking a warm sitz bath 2-3 times a day. A sitz bath is a warm water bath that is taken while you are sitting down. The water should only come up to your hips and should cover your buttocks. Breast care  Within the first few days after delivery, your breasts may feel heavy, full, and uncomfortable (breast engorgement). Milk may also leak from your breasts. Your health care provider can suggest ways to help relieve the discomfort. Breast engorgement should go away within a few days.  If you are breastfeeding: ? Wear a bra that supports your breasts and fits you well. ? Keep your nipples clean and dry. Apply creams and ointments as told by your health care provider. ? You may need to use breast pads to absorb milk that leaks from your breasts. ? You may have uterine contractions every time you breastfeed for up to several weeks after delivery. Uterine contractions help your uterus return to its normal size. ? If you have any problems with breastfeeding, work with your health care provider or lactation consultant.  If you are not breastfeeding: ? Avoid touching your breasts a lot. Doing this can make   your breasts produce more milk. ? Wear a good-fitting bra and use cold packs to help with swelling. ? Do not squeeze out (express) milk. This causes you to make more milk. Intimacy and sexuality  Ask your health care provider when you can engage in sexual activity. This may depend on: ? Your risk of infection. ? How fast you are healing. ? Your comfort and desire to engage in sexual activity.  You are able to get pregnant after delivery, even if you have not had  your period. If desired, talk with your health care provider about methods of birth control (contraception). Medicines  Take over-the-counter and prescription medicines only as told by your health care provider.  If you were prescribed an antibiotic medicine, take it as told by your health care provider. Do not stop taking the antibiotic even if you start to feel better. Activity  Gradually return to your normal activities as told by your health care provider. Ask your health care provider what activities are safe for you.  Rest as much as possible. Try to rest or take a nap while your baby is sleeping. Eating and drinking   Drink enough fluid to keep your urine pale yellow.  Eat high-fiber foods every day. These may help prevent or relieve constipation. High-fiber foods include: ? Whole grain cereals and breads. ? Brown rice. ? Beans. ? Fresh fruits and vegetables.  Do not try to lose weight quickly by cutting back on calories.  Take your prenatal vitamins until your postpartum checkup or until your health care provider tells you it is okay to stop. Lifestyle  Do not use any products that contain nicotine or tobacco, such as cigarettes and e-cigarettes. If you need help quitting, ask your health care provider.  Do not drink alcohol, especially if you are breastfeeding. General instructions  Keep all follow-up visits for you and your baby as told by your health care provider. Most women visit their health care provider for a postpartum checkup within the first 3-6 weeks after delivery. Contact a health care provider if:  You feel unable to cope with the changes that your child brings to your life, and these feelings do not go away.  You feel unusually sad or worried.  Your breasts become red, painful, or hard.  You have a fever.  You have trouble holding urine or keeping urine from leaking.  You have little or no interest in activities you used to enjoy.  You have not  breastfed at all and you have not had a menstrual period for 12 weeks after delivery.  You have stopped breastfeeding and you have not had a menstrual period for 12 weeks after you stopped breastfeeding.  You have questions about caring for yourself or your baby.  You pass a blood clot from your vagina. Get help right away if:  You have chest pain.  You have difficulty breathing.  You have sudden, severe leg pain.  You have severe pain or cramping in your lower abdomen.  You bleed from your vagina so much that you fill more than one sanitary pad in one hour. Bleeding should not be heavier than your heaviest period.  You develop a severe headache.  You faint.  You have blurred vision or spots in your vision.  You have bad-smelling vaginal discharge.  You have thoughts about hurting yourself or your baby. If you ever feel like you may hurt yourself or others, or have thoughts about taking your own life, get help  right away. You can go to the nearest emergency department or call:  Your local emergency services (911 in the [place]  A suicide crisis helpline, such as the National Suicide Prevention Lifeline at 1-800-273-8255. This is open 24 hours a day. Summary  The period of time right after you deliver your newborn up to 6-12 weeks after delivery is called the postpartum period.  Gradually return to your normal activities as told by your health care provider.  Keep all follow-up visits for you and your baby as told by your health care provider. This information is not intended to replace advice given to you by your health care provider. Make sure you discuss any questions you have with your health care provider. Document Revised: 11/20/2017 Document Reviewed: 08/31/2017 Elsevier Patient Education  2020 [employer]  Postpartum Baby Blues The postpartum period begins right after the birth of a baby. During this time, there is often a lot of joy and excitement. It is also a  time of many changes in the life of the parents. No matter how many times a mother gives birth, each child brings new challenges to the family, including different ways of relating to one another. It is common to have feelings of excitement along with confusing changes in moods, emotions, and thoughts. You may feel happy one minute and sad or stressed the next. These feelings of sadness usually happen in the period right after you have your baby, and they go away within a week or two. This is called the "baby blues." What are the causes? There is no known cause of baby blues. It is likely caused by a combination of factors. However, changes in hormone levels after childbirth are believed to trigger some of the symptoms. Other factors that can play a role in these mood changes include:  Lack of sleep.  Stressful life events, such as poverty, caring for a loved one, or death of a loved one.  Genetics. What are the signs or symptoms? Symptoms of this condition include:  Brief changes in mood, such as going from extreme happiness to sadness.  Decreased concentration.  Difficulty sleeping.  Crying spells and tearfulness.  Loss of appetite.  Irritability.  Anxiety. If the symptoms of baby blues last for more than 2 weeks or become more severe, you may have postpartum depression. How is this diagnosed? This condition is diagnosed based on an evaluation of your symptoms. There are no medical or lab tests that lead to a diagnosis, but there are various questionnaires that a health care provider may use to identify women with the baby blues or postpartum depression. How is this treated? Treatment is not needed for this condition. The baby blues usually go away on their own in 1-2 weeks. Social support is often all that is needed. You will be encouraged to get adequate sleep and rest. Follow these instructions at home: Lifestyle      Get as much rest as you can. Take a nap when the baby  sleeps.  Exercise regularly as told by your health care provider. Some women find yoga and walking to be helpful.  Eat a balanced and nourishing diet. This includes plenty of fruits and vegetables, whole grains, and lean proteins.  Do little things that you enjoy. Have a cup of tea, take a bubble bath, read your favorite magazine, or listen to your favorite music.  Avoid alcohol.  Ask for help with household chores, cooking, grocery shopping, or running errands. Do not try to   everything yourself. Consider hiring a postpartum doula to help. This is a professional who specializes in providing support to new mothers.  Try not to make any major life changes during pregnancy or right after giving birth. This can add stress. General instructions  Talk to people close to you about how you are feeling. Get support from your partner, family members, friends, or other new moms. You may want to join a support group.  Find ways to cope with stress. This may include: ? Writing your thoughts and feelings in a journal. ? Spending time outside. ? Spending time with people who make you laugh.  Try to stay positive in how you think. Think about the things you are grateful for.  Take over-the-counter and prescription medicines only as told by your health care provider.  Let your health care provider know if you have any concerns.  Keep all postpartum visits as told by your health care provider. This is important. Contact a health care provider if:  Your baby blues do not go away after 2 weeks. Get help right away if:  You have thoughts of taking your own life (suicidal thoughts).  You think you may harm the baby or other people.  You see or hear things that are not there (hallucinations). Summary  After giving birth, you may feel happy one minute and sad or stressed the next. Feelings of sadness that happen right after the baby is born and go away after a week or two are called the "baby  blues."  You can manage the baby blues by getting enough rest, eating a healthy diet, exercising, spending time with supportive people, and finding ways to cope with stress.  If feelings of sadness and stress last longer than 2 weeks or get in the way of caring for your baby, talk to your health care provider. This may mean you have postpartum depression. This information is not intended to replace advice given to you by your health care provider. Make sure you discuss any questions you have with your health care provider. Document Revised: 03/11/2019 Document Reviewed: 01/13/2017 Elsevier Patient Education  [place]. Breastfeeding Tips for a Good Latch Latching is how your baby's mouth attaches to your nipple to breastfeed. It is an important part of breastfeeding. Your baby may have trouble latching for a number of reasons. A poor latch may cause you to have cracked or sore nipples or other problems. Follow these instructions at home: How to position your baby  Find a comfortable place to sit or lie down. Your neck and back should be well supported.  If you are seated, place a pillow or rolled-up blanket under your baby. This will bring him or her to the level of your breast.  Make sure that your baby's belly (abdomen) is facing your belly.  Try different positions to find one that works best for you and your baby. How to help your baby latch   To start, gently rub your breast. Move your fingertips in a circle as you massage from your chest wall toward your nipple. This helps milk flow. Keep doing this during feeding if needed.  Position your breast. Hold your breast with four fingers underneath and your thumb above your nipple. Keep your fingers away from your nipple and your baby's mouth. Follow these steps to help your baby latch: 1. Rub your baby's lips gently with your finger or nipple. 2. When your baby's mouth is open wide enough, quickly bring your baby  to your breast  and place your whole nipple into your baby's mouth. Place as much of the colored area around your nipple (areola)as possible into your baby's mouth. 3. Your baby's tongue should be between his or her lower gum and your breast. 4. You should be able to see more areola above your baby's upper lip than below the lower lip. 5. When your baby starts sucking, you will feel a gentle pull on your nipple. You should not feel any pain. Be patient. It is common for a baby to suck for about 2-3 minutes to start the flow of breast milk. 6. Make sure that your baby's mouth is in the right position around your nipple. Your baby's lips should make a seal on your breast and be turned outward.  General instructions  Look for these signs that your baby has latched on to your nipple: ? The baby is quietly tugging or sucking without causing you pain. ? You hear the baby swallow after every 3 or 4 sucks. ? You see movement above and in front of the baby's ears while he or she is sucking.  Be aware of these signs that your baby has not latched on to your nipple: ? The baby makes sucking sounds or smacking sounds while feeding. ? You have nipple pain.  If your baby is not latched well, put your little finger between your baby's gums and your nipple. This will break the seal. Then try to help your baby latch again.  If you keep having problems, get help from a breastfeeding specialist (lactation consultant). Contact a doctor if:  You have cracking or soreness in your nipples that lasts longer than 1 week.  You have nipple pain.  Your breasts are filled with too much milk (engorgement), and this does not improve after 48-72 hours.  You have a plugged milk duct and a fever.  You follow the tips for a good latch but you keep having problems or concerns.  You have a pus-like fluid coming from your breast.  Your baby is not gaining weight.  Your baby loses weight. Summary  Latching is how your baby's mouth  attaches to your nipple to breastfeed.  Try different positions for breastfeeding to find one that works best for you and your baby.  A poor latch may cause you to have cracked or sore nipples or other problems. This information is not intended to replace advice given to you by your health care provider. Make sure you discuss any questions you have with your health care provider. Document Revised: 03/09/2019 Document Reviewed: 06/24/2017 Elsevier Patient Education  2020 [employer] Inc.  

## 2020-05-22 MED ORDER — FERROUS SULFATE 325 (65 FE) MG PO TABS: 325.0000 mg | ORAL_TABLET | Freq: Two times a day (BID) | ORAL | 0 refills | Status: DC

## 2020-05-22 MED ORDER — IBUPROFEN 600 MG PO TABS: 600.0000 mg | ORAL_TABLET | Freq: Four times a day (QID) | ORAL | 0 refills | Status: DC | PRN

## 2020-05-22 MED ORDER — LABETALOL HCL 200 MG PO TABS: 200.0000 mg | ORAL_TABLET | Freq: Two times a day (BID) | ORAL | 2 refills | Status: DC

## 2020-05-22 MED ORDER — IBUPROFEN 600 MG PO TABS
600.0000 mg | ORAL_TABLET | Freq: Four times a day (QID) | ORAL | Status: DC
  Administered 2020-05-22: 600 mg via ORAL
  Filled 2020-05-22: qty 1

## 2020-05-22 NOTE — Progress Notes (Signed)
Pt discharged with infant.  Discharge instructions, prescriptions and follow up appointment given to and reviewed with pt. Pt verbalized understanding. Escorted out by staff. 

## 2020-05-22 NOTE — Plan of Care (Signed)
Vs stable; up ad lib; taking scheduled motrin for pain control; tolerating regular diet; breastfeeding and declined assistance from RN this shift

## 2020-05-22 NOTE — Lactation Note (Signed)
This note was copied from a baby's chart. Lactation Consultation Note  Patient Name: Jessica Knight SLHTD'S Date: 05/22/2020 Reason for consult: Follow-up assessment  LC follow-up before discharge. Mom feeling more confident with breastfeeding, declined all BF assistance overnight. Baby was circumcised this morning, and was under bili lights while in the room. Mom did have questions re: baby having adequate intake and timing of mature milk, and when to supplement. LC addressed questions, provided education on signs of milk transfer, output expectations, and signs of transitional and mature milk. Encouraged putting baby to breast on demand, and possibility of upcoming growth spurt and cluster feeding.  Education given for nipple and breast care, difference between breast fullness and breast engorgement, signs of and care for plugged ducts and mastitis and when to seek care from MD. [facility] reviewed with mom outpatient lactation services and support as well as community breastfeeding resources.  Encouraged to call out today with questions, concerns, or for assistance before leaving if needed.   Maternal Data Formula Feeding for Exclusion: No Has patient been taught Hand Expression?: Yes Does the patient have breastfeeding experience prior to this delivery?: No  Feeding Feeding Type: Breast Fed  LATCH Score                   Interventions Interventions: Breast feeding basics reviewed  Lactation Tools Discussed/Used     Consult Status Consult Status: Complete Date: 05/22/20 Follow-up type: Call as needed    Danford Bad 05/22/2020, 9:42 AM

## 2020-06-20 DIAGNOSIS — M545 Low back pain, unspecified: Secondary | ICD-10-CM | POA: Insufficient documentation

## 2020-06-20 DIAGNOSIS — R200 Anesthesia of skin: Secondary | ICD-10-CM | POA: Insufficient documentation

## 2020-08-15 ENCOUNTER — Ambulatory Visit
Payer: No Typology Code available for payment source | Admitting: Student in an Organized Health Care Education/Training Program

## 2020-08-27 ENCOUNTER — Other Ambulatory Visit: Payer: Self-pay

## 2020-08-27 ENCOUNTER — Ambulatory Visit
Payer: No Typology Code available for payment source | Attending: Student in an Organized Health Care Education/Training Program | Admitting: Student in an Organized Health Care Education/Training Program

## 2020-08-27 ENCOUNTER — Encounter: Payer: Self-pay | Admitting: Student in an Organized Health Care Education/Training Program

## 2020-08-27 VITALS — BP 120/89 | HR 61 | Temp 97.3°F | Resp 14 | Ht 64.0 in | Wt 180.0 lb

## 2020-08-27 DIAGNOSIS — M5416 Radiculopathy, lumbar region: Secondary | ICD-10-CM | POA: Insufficient documentation

## 2020-08-27 DIAGNOSIS — M5412 Radiculopathy, cervical region: Secondary | ICD-10-CM | POA: Diagnosis not present

## 2020-08-27 DIAGNOSIS — M4802 Spinal stenosis, cervical region: Secondary | ICD-10-CM | POA: Insufficient documentation

## 2020-08-27 DIAGNOSIS — G5603 Carpal tunnel syndrome, bilateral upper limbs: Secondary | ICD-10-CM | POA: Diagnosis present

## 2020-08-27 DIAGNOSIS — M5136 Other intervertebral disc degeneration, lumbar region: Secondary | ICD-10-CM | POA: Diagnosis not present

## 2020-08-27 DIAGNOSIS — G894 Chronic pain syndrome: Secondary | ICD-10-CM | POA: Insufficient documentation

## 2020-08-27 DIAGNOSIS — M533 Sacrococcygeal disorders, not elsewhere classified: Secondary | ICD-10-CM | POA: Insufficient documentation

## 2020-08-27 DIAGNOSIS — M47818 Spondylosis without myelopathy or radiculopathy, sacral and sacrococcygeal region: Secondary | ICD-10-CM | POA: Diagnosis not present

## 2020-08-27 NOTE — Progress Notes (Signed)
Patient: Jessica Knight  Service Category: E/M  Provider: Gillis Santa, MD  DOB: December 04, 1980  DOS: 08/27/2020  Referring Provider: [facility]  MRN: [id number]  Setting: Ambulatory outpatient  PCP: Mindi Curling, PA-C  Type: New Patient  Specialty: Interventional Pain Management    Location: Office  Delivery: Face-to-face     Primary Reason(s) for Visit: Encounter for initial evaluation of one or more chronic problems (new to examiner) potentially causing chronic pain, and posing a threat to normal musculoskeletal function. (Level of risk: High) CC: Back Pain (lower) and Neck Pain  HPI  Ms. Shuffield is a 39 y.o. year old, female patient, who comes for the first time to our practice referred by [place] for our initial evaluation of her chronic pain. She has Gestational diabetes mellitus (GDM) requiring insulin; PPH (postpartum hemorrhage); Preeclampsia; Sacroiliac joint pain; SI joint arthritis; Lumbar degenerative disc disease; Lumbar radiculopathy (Right L5 EMG/NCV); Chronic pain syndrome; Cervical radicular pain (right C5/6/7); Bilateral carpal tunnel syndrome; and Foraminal stenosis of cervical region on their problem list. Today she comes in for evaluation of her Back Pain (lower) and Neck Pain  Pain Assessment: Location:   Neck Radiating: numbness in right 2nd and third fingers, back pain radiates down right leg to the right foot Onset: More than a month ago Duration: Chronic pain Quality: Burning, Pressure, Tingling, Sharp Severity: 8 /10 (subjective, self-reported pain score)  Effect on ADL: difficulty performing daily activities Timing: Constant Modifying factors: nothing BP: 120/89  HR: 61  Onset and Duration: Gradual and Present longer than 3 months Cause of pain: Trauma Severity: Getting worse, NAS-11 at its worse: 9/10, NAS-11 now: 9/10 and NAS-11 on the average: 9/10 Timing: During activity or exercise and After activity or exercise Aggravating Factors:  Bending, Lifiting and Prolonged standing Alleviating Factors: Medications and Warm showers or baths Associated Problems: Numbness, Tingling, Pain that wakes patient up and Pain that does not allow patient to sleep Quality of Pain: Burning, Shooting and Tingling Previous Examinations or Tests: MRI scan and X-rays Previous Treatments: Chiropractic manipulations, Epidural steroid injections, Physical Therapy, Pool exercises and Stretching exercises  Patient is a pleasant 39 year old female, veteran, who presents with a chief complaint of neck pain with radiation down to her right upper extremity in a dermatomal fashion.  She states that due to certain positions, her right middle finger and ring finger do go to sleep and she cannot feel them.  This is been going on for many years however this got worse after her pregnancy.  She is done physical therapy for this region on multiple occasions.  Of note patient moved from [place] in 2019.  There she was seeing pain management.  Of note she has also seen orthopedics here.  She has received bilateral carpal tunnel steroid injection with Dr. Candelaria Stagers which she states were helpful.  Patient has a cervical MRI completed.  This was done on July 13, 2020.  Cervical MRI shows pathology at C5-C6 which includes mild posterior disc osteophyte complex partially effacing the ventral CSF without causing canal stenosis.  Moderate to advanced bilateral uncovertebral hypertrophy with moderate to advanced bilateral neuroforaminal stenosis.  At C6-C7 there is moderate to advanced right uncovertebral hypertrophy with moderate to advanced right neuroforaminal stenosis.  Patient's second complaint is her low back pain with radiation into her right lower extremity down to her foot in an anterior distribution.  Of note, patient has also done physical therapy for this condition.  She has an EMG study/nerve  conduction velocity study that shows right L5 radiculopathy.  Of note she also has  a lumbar MRI performed.  This was done on 07/13/2020.  It shows mild mid and lower lumbar spondylosis and facet arthropathy most pronounced at L3-L4, L4-L5 and L5-S1.  Patient also has bilateral L5 pars defect with minimal grade 1 anterolisthesis.  We will try and obtain records from the patient's previous pain clinic in [place].  Of note, patient also has a history of bilateral SI joint arthropathy, dysfunction for which she has received bilateral sacroiliac joint injection performed in October 2019 with Dr. Francesco Runner in [place] which she states were helpful.    Meds   Current Outpatient Medications:  .  Albuterol Sulfate 108 (90 Base) MCG/ACT AEPB, Inhale 2 puffs into the lungs every 6 (six) hours as needed., Disp: , Rfl:  .  budesonide-formoterol (SYMBICORT) 80-4.5 MCG/ACT inhaler, INHALE 2 PUFFS BY MOUTH DAILY (RINSE MOUTH WELL WITH WATER AFTER EACH USE) ASTHMA CONTROLLER MEDICATION, Disp: , Rfl:  .  ferrous sulfate 325 (65 FE) MG tablet, Take 1 tablet (325 mg total) by mouth 2 (two) times daily with a meal., Disp: 60 tablet, Rfl: 0 .  fluticasone (FLONASE) 50 MCG/ACT nasal spray, Place 1 spray into both nostrils daily., Disp: , Rfl:  .  ibuprofen (ADVIL) 600 MG tablet, Take 1 tablet (600 mg total) by mouth every 6 (six) hours as needed for mild pain, moderate pain or cramping., Disp: 60 tablet, Rfl: 0 .  labetalol (NORMODYNE) 200 MG tablet, Take 1 tablet (200 mg total) by mouth 2 (two) times daily., Disp: 60 tablet, Rfl: 2 .  levothyroxine (SYNTHROID) 125 MCG tablet, Take 125 mcg by mouth daily before breakfast., Disp: , Rfl:  .  lidocaine (LIDODERM) 5 %, APPLY 1 PATCH TO SKIN ONCE DAILY (APPLY FOR 12 HOURS, THEN REMOVE FOR 12 HOURS) CUT IN HALF AND APPLY TO NECK AND LOWER BACK, Disp: , Rfl:  .  Multiple Vitamin (MULTIVITAMIN) tablet, Take 1 tablet by mouth daily., Disp: , Rfl:  .  cetirizine (ZYRTEC) 10 MG tablet, Take 10 mg by mouth daily. (Patient not taking: Reported on 05/18/2020), Disp:  , Rfl:  .  Prenatal 28-0.8 MG TABS, Take 1 tablet by mouth daily. (Patient not taking: Reported on 08/27/2020), Disp: , Rfl:     ROS  Cardiovascular: Abnormal heart rhythm Pulmonary or Respiratory: Wheezing and difficulty taking a deep full breath (Asthma) Neurological: No reported neurological signs or symptoms such as seizures, abnormal skin sensations, urinary and/or fecal incontinence, being born with an abnormal open spine and/or a tethered spinal cord Psychological-Psychiatric: No reported psychological or psychiatric signs or symptoms such as difficulty sleeping, anxiety, depression, delusions or hallucinations (schizophrenial), mood swings (bipolar disorders) or suicidal ideations or attempts Gastrointestinal: No reported gastrointestinal signs or symptoms such as vomiting or evacuating blood, reflux, heartburn, alternating episodes of diarrhea and constipation, inflamed or scarred liver, or pancreas or irrregular and/or infrequent bowel movements Genitourinary: No reported renal or genitourinary signs or symptoms such as difficulty voiding or producing urine, peeing blood, non-functioning kidney, kidney stones, difficulty emptying the bladder, difficulty controlling the flow of urine, or chronic kidney disease Hematological: No reported hematological signs or symptoms such as prolonged bleeding, low or poor functioning platelets, bruising or bleeding easily, hereditary bleeding problems, low energy levels due to low hemoglobin or being anemic Endocrine: Slow thyroid Rheumatologic: No reported rheumatological signs and symptoms such as fatigue, joint pain, tenderness, swelling, redness, heat, stiffness, decreased range of motion, with or  without associated rash Musculoskeletal: Negative for myasthenia gravis, muscular dystrophy, multiple sclerosis or malignant hyperthermia   Allergies  Ms. Macke has No Known Allergies.  Laboratory Chemistry Profile   Renal Lab Results  Component  Value Date   BUN 12 05/21/2020   CREATININE 1.00 05/21/2020   LABCREA 33 05/18/2020   GFRAA >60 05/21/2020   GFRNONAA >60 05/21/2020   PROTEINUR NEGATIVE 11/20/2019     Electrolytes Lab Results  Component Value Date   NA 136 05/21/2020   K 3.8 05/21/2020   CL 109 05/21/2020   CALCIUM 7.4 (L) 05/21/2020   MG 5.8 (H) 05/20/2020     Hepatic Lab Results  Component Value Date   AST 37 05/21/2020   ALT 16 05/21/2020   ALBUMIN 2.3 (L) 05/21/2020   ALKPHOS 146 (H) 05/21/2020     ID Lab Results  Component Value Date   SARSCOV2NAA NEGATIVE 05/18/2020   PREGTESTUR POSITIVE (A) 11/20/2019     Bone No results found for: [place], [id number], [id number], [id number], 25OHVITD1, 25OHVITD2, 25OHVITD3, TESTOFREE, TESTOSTERONE   Endocrine Lab Results  Component Value Date   GLUCOSE 105 (H) 05/21/2020   GLUCOSEU NEGATIVE 11/20/2019     Neuropathy No results found for: VITAMINB12, FOLATE, HGBA1C, HIV   CNS No results found for: COLORCSF, APPEARCSF, RBCCOUNTCSF, WBCCSF, POLYSCSF, LYMPHSCSF, EOSCSF, PROTEINCSF, GLUCCSF, JCVIRUS, CSFOLI, IGGCSF, LABACHR, ACETBL, LABACHR, ACETBL   Inflammation (CRP: Acute  ESR: Chronic) No results found for: CRP, ESRSEDRATE, LATICACIDVEN   Rheumatology No results found for: RF, ANA, LABURIC, URICUR, LYMEIGGIGMAB, LYMEABIGMQN, HLAB27   Coagulation Lab Results  Component Value Date   PLT 198 05/21/2020     Cardiovascular Lab Results  Component Value Date   TROPONINI <0.03 05/22/2019   HGB 8.7 (L) 05/21/2020   HCT 25.2 (L) 05/21/2020     Screening Lab Results  Component Value Date   SARSCOV2NAA NEGATIVE 05/18/2020   PREGTESTUR POSITIVE (A) 11/20/2019     Cancer No results found for: CEA, CA125, LABCA2   Allergens No results found for: ALMOND, APPLE, ASPARAGUS, AVOCADO, BANANA, BARLEY, BASIL, BAYLEAF, GREENBEAN, LIMABEAN, WHITEBEAN, BEEFIGE, REDBEET, BLUEBERRY, BROCCOLI, CABBAGE, MELON, CARROT, CASEIN, CASHEWNUT, CAULIFLOWER, CELERY      Note: Lab results reviewed.  [place]  Drug: Ms. Nelson  reports previous drug use. Alcohol:  reports previous alcohol use. Tobacco:  reports that she has never smoked. She has never used smokeless tobacco. Medical:  has a past medical history of Asthma, Diabetes mellitus without complication ([place]), and Thyroid disease. Family: family history includes Breast cancer in her maternal aunt; Diabetes in her maternal grandmother.  Past Surgical History:  Procedure Laterality Date  . BREAST SURGERY    . HERNIA REPAIR    . LASIK     Active Ambulatory Problems    Diagnosis Date Noted  . Gestational diabetes mellitus (GDM) requiring insulin 05/18/2020  . PPH (postpartum hemorrhage) 05/20/2020  . Preeclampsia 05/20/2020  . Sacroiliac joint pain 08/27/2020  . 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  . Lumbar degenerative disc disease 08/27/2020  . Lumbar radiculopathy (Right L5 EMG/NCV) 08/27/2020  . Chronic pain syndrome 08/27/2020  . Cervical radicular pain (right C5/6/7) 08/27/2020  . Bilateral carpal tunnel syndrome 08/27/2020  . Foraminal stenosis of cervical region 08/27/2020   Resolved Ambulatory Problems    Diagnosis Date Noted  . No Resolved Ambulatory Problems   Past Medical History:  Diagnosis Date  . Asthma   . Diabetes mellitus without complication ([place])   . Thyroid disease    Constitutional Exam  General appearance: Well nourished, well developed, and well hydrated. In no apparent acute distress Vitals:   08/27/20 1239  BP: 120/89  Pulse: 61  Resp: 14  Temp: (!) 97.3 F (36.3 C)  TempSrc: Temporal  SpO2: 98%  Weight: 180 lb (81.6 kg)  Height: _0  (1.626 m)   BMI Assessment: Estimated body mass index is 30.9 kg/m as calculated from the following:   Height as of this encounter: _1  (1.626 m).   Weight as of this encounter: 180 lb (81.6 kg).  BMI interpretation table: BMI level Category Range association with higher incidence of chronic pain  <18 kg/m2 Underweight    18.5-24.9 kg/m2 Ideal body weight   25-29.9 kg/m2 Overweight Increased incidence by 20%  30-34.9 kg/m2 Obese (Class I) Increased incidence by 68%  35-39.9 kg/m2 Severe obesity (Class II) Increased incidence by 136%  >40 kg/m2 Extreme obesity (Class III) Increased incidence by 254%   Patient's current BMI Ideal Body weight  Body mass index is 30.9 kg/m. Ideal body weight: 54.7 kg (120 lb 9.5 oz) Adjusted ideal body weight: 65.5 kg (144 lb 5.7 oz)   BMI Readings from Last 4 Encounters:  08/27/20 30.90 kg/m  05/18/20 35.53 kg/m  02/21/20 32.22 kg/m  12/07/19 31.86 kg/m   Wt Readings from Last 4 Encounters:  08/27/20 180 lb (81.6 kg)  05/18/20 207 lb (93.9 kg)  02/21/20 187 lb 11.2 oz (85.1 kg)  12/07/19 185 lb 9.6 oz (84.2 kg)    Psych/Mental status: Alert, oriented x 3 (person, place, & time)       Eyes: PERLA Respiratory: No evidence of acute respiratory distress  Cervical Spine Exam  Skin & Axial Inspection: No masses, redness, edema, swelling, or associated skin lesions Alignment: Symmetrical Functional ROM: Pain restricted ROM     Positive Spurling's on the right Stability: No instability detected Muscle Tone/Strength: Functionally intact. No obvious neuro-muscular anomalies detected. Sensory (Neurological): Dermatomal pain pattern right C6, C7 Palpation: No palpable anomalies              Upper Extremity (UE) Exam    Side: Right upper extremity  Side: Left upper extremity  Skin & Extremity Inspection: Skin color, temperature, and hair growth are WNL. No peripheral edema or cyanosis. No masses, redness, swelling, asymmetry, or associated skin lesions. No contractures.  Skin & Extremity Inspection: Skin color, temperature, and hair growth are WNL. No peripheral edema or cyanosis. No masses, redness, swelling, asymmetry, or associated skin lesions. No contractures.  Functional ROM: Pain restricted ROM for all joints of upper extremity  Functional ROM: Unrestricted ROM           Muscle Tone/Strength: Movement possible against some resistance (4/5) elbow flexion, extension   Muscle Tone/Strength: Functionally intact. No obvious neuro-muscular anomalies detected.  Sensory (Neurological): Neurogenic pain pattern          Sensory (Neurological): Unimpaired          Palpation: No palpable anomalies              Palpation: No palpable anomalies              Provocative Test(s):  Phalen's test: deferred Tinel's test: deferred Apley's scratch test (touch opposite shoulder):  Action 1 (Across chest): Decreased ROM Action 2 (Overhead): Decreased ROM Action 3 (LB reach): Decreased ROM   Provocative Test(s):  Phalen's test: deferred Tinel's test: deferred Apley's scratch test (touch opposite shoulder):  Action 1 (Across chest): deferred Action 2 (Overhead): deferred Action 3 (LB  reach): deferred    Thoracic Spine Area Exam  Skin & Axial Inspection: No masses, redness, or swelling Alignment: Symmetrical Functional ROM: Unrestricted ROM Stability: No instability detected Muscle Tone/Strength: Functionally intact. No obvious neuro-muscular anomalies detected. Sensory (Neurological): Unimpaired Muscle strength & Tone: No palpable anomalies  Lumbar Exam  Skin & Axial Inspection: No masses, redness, or swelling Alignment: Symmetrical Functional ROM: Pain restricted ROM       Stability: No instability detected Muscle Tone/Strength: Functionally intact. No obvious neuro-muscular anomalies detected. Sensory (Neurological): Musculoskeletal pain pattern  Provocative Tests: Hyperextension/rotation test: deferred today       Lumbar quadrant test (Kemp's test): deferred today       Lateral bending test: deferred today       Patrick's Maneuver: (+) for bilateral S-I arthralgia             FABER* test: (+) for bilateral S-I arthralgia             S-I anterior distraction/compression test: (+) bilateral S-I arthralgia/arthropathy S-I lateral compression test: (+)  bilateral       S-I Thigh-thrust test: deferred today         S-I Gaenslen's test: deferred today         *(Flexion, ABduction and External Rotation)  Gait & Posture Assessment  Ambulation: Unassisted Gait: Relatively normal for age and body habitus Posture: WNL   Lower Extremity Exam    Side: Right lower extremity  Side: Left lower extremity  Stability: No instability observed          Stability: No instability observed          Skin & Extremity Inspection: Skin color, temperature, and hair growth are WNL. No peripheral edema or cyanosis. No masses, redness, swelling, asymmetry, or associated skin lesions. No contractures.  Skin & Extremity Inspection: Skin color, temperature, and hair growth are WNL. No peripheral edema or cyanosis. No masses, redness, swelling, asymmetry, or associated skin lesions. No contractures.  Functional ROM: Unrestricted ROM                  Functional ROM: Unrestricted ROM                  Muscle Tone/Strength: Functionally intact. No obvious neuro-muscular anomalies detected.  Muscle Tone/Strength: Functionally intact. No obvious neuro-muscular anomalies detected.  Sensory (Neurological): Unimpaired        Sensory (Neurological): Unimpaired        DTR: Patellar: deferred today Achilles: deferred today Plantar: deferred today  DTR: Patellar: deferred today Achilles: deferred today Plantar: deferred today  Palpation: No palpable anomalies  Palpation: No palpable anomalies   Assessment  Primary Diagnosis & Pertinent Problem List: The primary encounter diagnosis was Sacroiliac joint pain. Diagnoses of SI joint arthritis, Cervical radicular pain (right C5/7), Lumbar degenerative disc disease, Lumbar radiculopathy (Right L5 EMG/NCV), Bilateral carpal tunnel syndrome, Chronic pain syndrome, and Foraminal stenosis of cervical region were also pertinent to this visit.  Visit Diagnosis (New problems to examiner): 1. Sacroiliac joint pain   2. SI joint arthritis    3. Cervical radicular pain (right C5/7)   4. Lumbar degenerative disc disease   5. Lumbar radiculopathy (Right L5 EMG/NCV)   6. Bilateral carpal tunnel syndrome   7. Chronic pain syndrome   8. Foraminal stenosis of cervical region    Plan of Care (Initial workup plan)  General Recommendations: The pain condition that the patient suffers from is best treated with a multidisciplinary approach that involves an  increase in physical activity to prevent de-conditioning and worsening of the pain cycle, as well as psychological counseling (formal and/or informal) to address the co-morbid psychological affects of pain. Treatment will often involve judicious use of pain medications and interventional procedures to decrease the pain, allowing the patient to participate in the physical activity that will ultimately produce long-lasting pain reductions. The goal of the multidisciplinary approach is to return the patient to a higher level of overall function and to restore their ability to perform activities of daily living.  Problem-specific plan: Sacroiliac joint pain Bilateral SI joint arthralgia with therapeutic benefit after bilateral SI joint injection performed in [facility] with Dr. Francesco Runner on 09/19/2018.  Return if symptoms with physical exam findings consistent with SI joint arthropathy.  Plan for bilateral SI joint injection with sedation.  Cervical radicular pain (right C5/6/7) Has already completed physical therapy for her cervical radicular pain.  This was recently done last 4 weeks.Cervical MRI shows pathology at C5-C6 which includes mild posterior disc osteophyte complex partially effacing the ventral CSF without causing canal stenosis.  Moderate to advanced bilateral uncovertebral hypertrophy with moderate to advanced bilateral neuroforaminal stenosis.  At C6-C7 there is moderate to advanced right uncovertebral hypertrophy with moderate to advanced right neuroforaminal stenosis.  For right C5-C6  and C6-C7 radicular pain, recommend right cervical epidural steroid injection under fluoroscopic guidance with sedation.  Risks and benefits reviewed and patient would like to proceed.   Lumbar radiculopathy (Right L5 EMG/NCV) Consider spinal cord stimulator trial in future   Procedure Orders     SACROILIAC JOINT INJECTION     Cervical Epidural Injection Pharmacotherapy (current):  Interventional management options: Ms. Neises was informed that there is no guarantee that she would be a candidate for interventional therapies. The decision will be based on the results of diagnostic studies, as well as Ms. Muffley's risk profile.  Procedure(s) under consideration:  Right cervical epidural steroid injection Cervical facet medial branch nerve block Bilateral sacroiliac joint injection Spinal cord stimulator trial   Provider-requested follow-up: Return in about 2 weeks (around 09/10/2020) for R C7-T1 ESI, B/L SI-J with sedation (40 mins).  Future Appointments  Date Time Provider [place]  09/19/2020  8:40 AM Gillis Santa, MD [facility] None  12/06/2020 11:30 AM Alfonso Patten, MD [facility] None    Note by: Gillis Santa, MD Date: 08/27/2020; Time: 1:32 PM

## 2020-08-27 NOTE — Progress Notes (Signed)
Safety precautions to be maintained throughout the outpatient stay will include: orient to surroundings, keep bed in low position, maintain call bell within reach at all times, provide assistance with transfer out of bed and ambulation.  

## 2020-08-27 NOTE — Assessment & Plan Note (Signed)
Consider spinal cord stimulator trial in future 

## 2020-08-27 NOTE — Assessment & Plan Note (Signed)
Bilateral SI joint arthralgia with therapeutic benefit after bilateral SI joint injection performed in [facility] with Dr. Laurian Brim on 09/19/2018.  Return if symptoms with physical exam findings consistent with SI joint arthropathy.  Plan for bilateral SI joint injection with sedation.

## 2020-08-27 NOTE — Assessment & Plan Note (Signed)
Has already completed physical therapy for her cervical radicular pain.  This was recently done last 4 weeks.Cervical MRI shows pathology at C5-C6 which includes mild posterior disc osteophyte complex partially effacing the ventral CSF without causing canal stenosis.  Moderate to advanced bilateral uncovertebral hypertrophy with moderate to advanced bilateral neuroforaminal stenosis.  At C6-C7 there is moderate to advanced right uncovertebral hypertrophy with moderate to advanced right neuroforaminal stenosis.  For right C5-C6 and C6-C7 radicular pain, recommend right cervical epidural steroid injection under fluoroscopic guidance with sedation.  Risks and benefits reviewed and patient would like to proceed.

## 2020-08-27 NOTE — Patient Instructions (Signed)
____________________________________________________________________________________________  Preparing for Procedure with Sedation  Procedure appointments are limited to planned procedures: . No Prescription Refills. . No disability issues will be discussed. . No medication changes will be discussed.  Instructions: . Oral Intake: Do not eat or drink anything for at least 8 hours prior to your procedure. (Exception: Blood Pressure Medication. See below.) . Transportation: Unless otherwise stated by your physician, you may drive yourself after the procedure. . Blood Pressure Medicine: Do not forget to take your blood pressure medicine with a sip of water the morning of the procedure. If your Diastolic (lower reading)is above 100 mmHg, elective cases will be cancelled/rescheduled. . Blood thinners: These will need to be stopped for procedures. Notify our staff if you are taking any blood thinners. Depending on which one you take, there will be specific instructions on how and when to stop it. . Diabetics on insulin: Notify the staff so that you can be scheduled 1st case in the morning. If your diabetes requires high dose insulin, take only  of your normal insulin dose the morning of the procedure and notify the staff that you have done so. . Preventing infections: Shower with an antibacterial soap the morning of your procedure. . Build-up your immune system: Take 1000 mg of Vitamin C with every meal (3 times a day) the day prior to your procedure. . Antibiotics: Inform the staff if you have a condition or reason that requires you to take antibiotics before dental procedures. . Pregnancy: If you are pregnant, call and cancel the procedure. . Sickness: If you have a cold, fever, or any active infections, call and cancel the procedure. . Arrival: You must be in the facility at least 30 minutes prior to your scheduled procedure. . Children: Do not bring children with you. . Dress appropriately:  Bring dark clothing that you would not mind if they get stained. . Valuables: Do not bring any jewelry or valuables.  Reasons to call and reschedule or cancel your procedure: (Following these recommendations will minimize the risk of a serious complication.) . Surgeries: Avoid having procedures within 2 weeks of any surgery. (Avoid for 2 weeks before or after any surgery). . Flu Shots: Avoid having procedures within 2 weeks of a flu shots or . (Avoid for 2 weeks before or after immunizations). . Barium: Avoid having a procedure within 7-10 days after having had a radiological study involving the use of radiological contrast. (Myelograms, Barium swallow or enema study). . Heart attacks: Avoid any elective procedures or surgeries for the initial 6 months after a "Myocardial Infarction" (Heart Attack). . Blood thinners: It is imperative that you stop these medications before procedures. Let us know if you if you take any blood thinner.  . Infection: Avoid procedures during or within two weeks of an infection (including chest colds or gastrointestinal problems). Symptoms associated with infections include: Localized redness, fever, chills, night sweats or profuse sweating, burning sensation when voiding, cough, congestion, stuffiness, runny nose, sore throat, diarrhea, nausea, vomiting, cold or Flu symptoms, recent or current infections. It is specially important if the infection is over the area that we intend to treat. . Heart and lung problems: Symptoms that may suggest an active cardiopulmonary problem include: cough, chest pain, breathing difficulties or shortness of breath, dizziness, ankle swelling, uncontrolled high or unusually low blood pressure, and/or palpitations. If you are experiencing any of these symptoms, cancel your procedure and contact your primary care physician for an evaluation.  Remember:  Regular Business hours are:    Monday to Thursday 8:00 AM to 4:00 PM  Provider's  Schedule: Delano Metz, MD:  Procedure days: Tuesday and Thursday 7:30 AM to 4:00 PM  Edward Jolly, MD:  Procedure days: Monday and Wednesday 7:30 AM to 4:00 PM ____________________________________________________________________________________________  Sacroiliac (SI) Joint Injection Patient Information  Description: The sacroiliac joint connects the scrum (very low back and tailbone) to the ilium (a pelvic bone which also forms half of the hip joint).  Normally this joint experiences very little motion.  When this joint becomes inflamed or unstable low back and or hip and pelvis pain may result.  Injection of this joint with local anesthetics (numbing medicines) and steroids can provide diagnostic information and reduce pain.  This injection is performed with the aid of x-ray guidance into the tailbone area while you are lying on your stomach.   You may experience an electrical sensation down the leg while this is being done.  You may also experience numbness.  We also may ask if we are reproducing your normal pain during the injection.  Conditions which may be treated SI injection:   Low back, buttock, hip or leg pain  Preparation for the Injection:  1. Do not eat any solid food or dairy products within 8 hours of your appointment.  2. You may drink clear liquids up to 3 hours before appointment.  Clear liquids include water, black coffee, juice or soda.  No milk or cream please. 3. You may take your regular medications, including pain medications with a sip of water before your appointment.  Diabetics should hold regular insulin (if take separately) and take 1/2 normal NPH dose the morning of the procedure.  Carry some sugar containing items with you to your appointment. 4. A driver must accompany you and be prepared to drive you home after your procedure. 5. Bring all of your current medications with you. 6. An IV may be inserted and sedation may be given at the discretion of the  physician. 7. A blood pressure cuff, EKG and other monitors will often be applied during the procedure.  Some patients may need to have extra oxygen administered for a short period.  8. You will be asked to provide medical information, including your allergies, prior to the procedure.  We must know immediately if you are taking blood thinners (like Coumadin/Warfarin) or if you are allergic to IV iodine contrast (dye).  We must know if you could possible be pregnant.  Possible side effects:   Bleeding from needle site  Infection (rare, may require surgery)  Nerve injury (rare)  Numbness & tingling (temporary)  A brief convulsion or seizure  Light-headedness (temporary)  Pain at injection site (several days)  Decreased blood pressure (temporary)  Weakness in the leg (temporary)   Call if you experience:   New onset weakness or numbness of an extremity below the injection site that last more than 8 hours.  Hives or difficulty breathing ( go to the emergency room)  Inflammation or drainage at the injection site  Any new symptoms which are concerning to you  Please note:  Although the local anesthetic injected can often make your back/ hip/ buttock/ leg feel good for several hours after the injections, the pain will likely return.  It takes 3-7 days for steroids to work in the sacroiliac area.  You may not notice any pain relief for at least that one week.  If effective, we will often do a series of three injections spaced 3-6 weeks apart to  maximally decrease your pain.  After the initial series, we generally will wait some months before a repeat injection of the same type.  If you have any questions, please call [phone] [facility]  Sacroiliac (SI) Joint Injection Patient Information  Description: The sacroiliac joint connects the scrum (very low back and tailbone) to the ilium (a pelvic bone which also forms half of the hip  joint).  Normally this joint experiences very little motion.  When this joint becomes inflamed or unstable low back and or hip and pelvis pain may result.  Injection of this joint with local anesthetics (numbing medicines) and steroids can provide diagnostic information and reduce pain.  This injection is performed with the aid of x-ray guidance into the tailbone area while you are lying on your stomach.   You may experience an electrical sensation down the leg while this is being done.  You may also experience numbness.  We also may ask if we are reproducing your normal pain during the injection.  Conditions which may be treated SI injection:   Low back, buttock, hip or leg pain  Preparation for the Injection:  9. Do not eat any solid food or dairy products within 8 hours of your appointment.  10. You may drink clear liquids up to 3 hours before appointment.  Clear liquids include water, black coffee, juice or soda.  No milk or cream please. 11. You may take your regular medications, including pain medications with a sip of water before your appointment.  Diabetics should hold regular insulin (if take separately) and take 1/2 normal NPH dose the morning of the procedure.  Carry some sugar containing items with you to your appointment. 12. A driver must accompany you and be prepared to drive you home after your procedure. 13. Bring all of your current medications with you. 14. An IV may be inserted and sedation may be given at the discretion of the physician. 15. A blood pressure cuff, EKG and other monitors will often be applied during the procedure.  Some patients may need to have extra oxygen administered for a short period.  16. You will be asked to provide medical information, including your allergies, prior to the procedure.  We must know immediately if you are taking blood thinners (like Coumadin/Warfarin) or if you are allergic to IV iodine contrast (dye).  We must know if you could possible  be pregnant.  Possible side effects:   Bleeding from needle site  Infection (rare, may require surgery)  Nerve injury (rare)  Numbness & tingling (temporary)  A brief convulsion or seizure  Light-headedness (temporary)  Pain at injection site (several days)  Decreased blood pressure (temporary)  Weakness in the leg (temporary)   Call if you experience:   New onset weakness or numbness of an extremity below the injection site that last more than 8 hours.  Hives or difficulty breathing ( go to the emergency room)  Inflammation or drainage at the injection site  Any new symptoms which are concerning to you  Please note:  Although the local anesthetic injected can often make your back/ hip/ buttock/ leg feel good for several hours after the injections, the pain will likely return.  It takes 3-7 days for steroids to work in the sacroiliac area.  You may not notice any pain relief for at least that one week.  If effective, we will often do a series of three injections spaced 3-6 weeks apart to maximally decrease  your pain.  After the initial series, we generally will wait some months before a repeat injection of the same type.  If you have any questions, please call [phone]  [facility]   Epidural Steroid Injection  An epidural steroid injection is a shot of steroid medicine and numbing medicine that is given into the space between the spinal cord and the bones of the back (epidural space). The shot helps relieve pain caused by an irritated or swollen nerve root. The amount of pain relief you get from the injection depends on what is causing the nerve to be swollen and irritated, and how long your pain lasts. You are more likely to benefit from this injection if your pain is strong and comes on suddenly rather than if you have had long-term (chronic) pain. Tell a health care provider about:  Any allergies you have.  All medicines  you are taking, including vitamins, herbs, eye drops, creams, and over-the-counter medicines.  Any problems you or family members have had with anesthetic medicines.  Any blood disorders you have.  Any surgeries you have had.  Any medical conditions you have.  Whether you are pregnant or may be pregnant. What are the risks? Generally, this is a safe procedure. However, problems may occur, including:  Headache.  Bleeding.  Infection.  Allergic reaction to medicines.  Nerve damage. What happens before the procedure? Staying hydrated Follow instructions from your health care provider about hydration, which may include:  Up to 2 hours before the procedure - you may continue to drink clear liquids, such as water, clear fruit juice, black coffee, and plain tea. Eating and drinking restrictions Follow instructions from your health care provider about eating and drinking, which may include:  8 hours before the procedure - stop eating heavy meals or foods, such as meat, fried foods, or fatty foods.  6 hours before the procedure - stop eating light meals or foods, such as toast or cereal.  6 hours before the procedure - stop drinking milk or drinks that contain milk.  2 hours before the procedure - stop drinking clear liquids. Medicines  You may be given medicines to lower anxiety.  Ask your health care provider about: ? Changing or stopping your regular medicines. This is especially important if you are taking diabetes medicines or blood thinners. ? Taking medicines such as aspirin and ibuprofen. These medicines can thin your blood. Do not take these medicines unless your health care provider tells you to take them. ? Taking over-the-counter medicines, vitamins, herbs, and supplements.  Ask your health care provider what steps will be taken to prevent infection. General instructions  Plan to have someone take you home from the hospital or clinic.  If you will be going home  right after the procedure, plan to have someone with you for 24 hours. What happens during the procedure?  An IV will be inserted into one of your veins.  You will be given one or more of the following: ? A medicine to help you relax (sedative). ? A medicine to numb the area (local anesthetic).  You will be asked to lie on your abdomen or sit.  The injection site will be cleaned.  A needle will be inserted through your skin into the epidural space. This may cause you some discomfort. An X-ray machine will be used to guide the needle as close as possible to the affected nerve.  A steroid medicine and a local anesthetic will be  injected into the epidural space.  The needle and IV will be removed.  A bandage (dressing) will be put over the injection site. The procedure may vary among health care providers and hospitals. What can I expect after the procedure? Follow these instructions at home: Injection site care  You may remove the bandage (dressing) after 24 hours.  Check your injection site every day for signs of infection. Check for: ? Redness, swelling, or pain. ? Fluid or blood. ? Warmth. ? Pus or a bad smell. Managing pain, stiffness, and swelling  For 24 hours after the procedure: ? Avoid using heat on the injection site. ? Do not take baths, swim, or use a hot tub until your health care provider approves. Ask your health care provider if you may take a shower. You may only be allowed to take sponge baths.  If directed, put ice on the injection site. To do this: ? Put ice in a plastic bag. ? Place a towel between your skin and the bag. ? Leave the ice on for 20 minutes, 2-3 times a day.  Activity  Do not drive for 24 hours if you were given a sedative during your procedure.  Return to your normal activities as told by your health care provider. Ask your health care provider what activities are safe for you. General instructions  Your blood pressure, heart rate,  breathing rate, and blood oxygen level will be monitored until you leave the hospital or clinic.  Your arm or leg may feel weak or numb for a few hours.  The injection site may feel sore.  Take over-the-counter and prescription medicines only as told by your health care provider.  Drink enough fluid to keep your urine pale yellow.  Keep all follow-up visits as told by your health care provider. This is important. Contact a health care provider if:  You have any of these signs of infection: ? Redness, swelling, or pain around your injection site. ? Fluid or blood coming from your injection site. ? Warmth coming from your injection site. ? Pus or a bad smell coming from your injection site. ? A fever.  You continue to have pain and soreness around the injection site, even after taking over-the-counter pain medicine.  You have severe, sudden, or lasting nausea or vomiting. Get help right away if:  You have severe pain at the injection site that is not relieved by medicines.  You develop a severe headache or a stiff neck.  You become sensitive to light.  You have any new numbness or weakness in your legs or arms.  You lose control of your bladder or bowel movements.  You have trouble breathing. Summary  An epidural steroid injection is a shot of steroid medicine and numbing medicine that is given into the epidural space.  The shot helps relieve pain caused by an irritated or swollen nerve root.  You are more likely to benefit from this injection if your pain is strong and comes on suddenly rather than if you have had chronic pain. This information is not intended to replace advice given to you by your health care provider. Make sure you discuss any questions you have with your health care provider. Document Revised: 05/30/2019 Document Reviewed: 05/30/2019 [employer] Patient Education  2020 [employer].

## 2020-09-19 ENCOUNTER — Ambulatory Visit
Admission: RE | Admit: 2020-09-19 | Discharge: 2020-09-19 | Disposition: A | Discharge status: Home / Self Care | Payer: No Typology Code available for payment source | Source: Ambulatory Visit | Attending: Student in an Organized Health Care Education/Training Program | Admitting: Student in an Organized Health Care Education/Training Program

## 2020-09-19 ENCOUNTER — Ambulatory Visit (HOSPITAL_BASED_OUTPATIENT_CLINIC_OR_DEPARTMENT_OTHER)
Payer: No Typology Code available for payment source | Admitting: Student in an Organized Health Care Education/Training Program

## 2020-09-19 ENCOUNTER — Other Ambulatory Visit: Payer: Self-pay

## 2020-09-19 ENCOUNTER — Encounter: Payer: Self-pay | Admitting: Student in an Organized Health Care Education/Training Program

## 2020-09-19 VITALS — BP 115/76 | HR 54 | Temp 97.1°F | Resp 15 | Ht 64.0 in | Wt 177.0 lb

## 2020-09-19 DIAGNOSIS — M5412 Radiculopathy, cervical region: Secondary | ICD-10-CM | POA: Insufficient documentation

## 2020-09-19 DIAGNOSIS — M47818 Spondylosis without myelopathy or radiculopathy, sacral and sacrococcygeal region: Secondary | ICD-10-CM | POA: Diagnosis not present

## 2020-09-19 DIAGNOSIS — M533 Sacrococcygeal disorders, not elsewhere classified: Secondary | ICD-10-CM | POA: Insufficient documentation

## 2020-09-19 DIAGNOSIS — G894 Chronic pain syndrome: Secondary | ICD-10-CM

## 2020-09-19 DIAGNOSIS — M461 Sacroiliitis, not elsewhere classified: Secondary | ICD-10-CM

## 2020-09-19 DIAGNOSIS — M4802 Spinal stenosis, cervical region: Secondary | ICD-10-CM

## 2020-09-19 MED ORDER — SODIUM CHLORIDE 0.9% FLUSH
1.0000 mL | Freq: Once | INTRAVENOUS | Status: AC
  Administered 2020-09-19: 1 mL

## 2020-09-19 MED ORDER — METHYLPREDNISOLONE ACETATE 40 MG/ML IJ SUSP
40.0000 mg | Freq: Once | INTRAMUSCULAR | Status: AC
  Administered 2020-09-19: 40 mg via INTRA_ARTICULAR
  Filled 2020-09-19: qty 1

## 2020-09-19 MED ORDER — DEXAMETHASONE SODIUM PHOSPHATE 10 MG/ML IJ SOLN
10.0000 mg | Freq: Once | INTRAMUSCULAR | Status: AC
  Administered 2020-09-19: 10 mg
  Filled 2020-09-19: qty 1

## 2020-09-19 MED ORDER — ROPIVACAINE HCL 2 MG/ML IJ SOLN
9.0000 mL | Freq: Once | INTRAMUSCULAR | Status: AC
  Administered 2020-09-19: 9 mL via INTRA_ARTICULAR
  Filled 2020-09-19: qty 10

## 2020-09-19 MED ORDER — IOHEXOL 180 MG/ML  SOLN
10.0000 mL | Freq: Once | INTRAMUSCULAR | Status: AC
  Administered 2020-09-19: 10 mL via EPIDURAL

## 2020-09-19 MED ORDER — FENTANYL CITRATE (PF) 100 MCG/2ML IJ SOLN
25.0000 ug | INTRAMUSCULAR | Status: AC | PRN
  Administered 2020-09-19 (×3): 25 ug via INTRAVENOUS
  Filled 2020-09-19: qty 2

## 2020-09-19 MED ORDER — MIDAZOLAM HCL 5 MG/5ML IJ SOLN
1.0000 mg | INTRAMUSCULAR | Status: DC | PRN
  Administered 2020-09-19 (×2): 0.5 mg via INTRAVENOUS
  Filled 2020-09-19: qty 5

## 2020-09-19 MED ORDER — LIDOCAINE HCL 2 % IJ SOLN
20.0000 mL | Freq: Once | INTRAMUSCULAR | Status: AC
  Administered 2020-09-19: 400 mg
  Filled 2020-09-19: qty 40

## 2020-09-19 MED ORDER — ROPIVACAINE HCL 2 MG/ML IJ SOLN
1.0000 mL | Freq: Once | INTRAMUSCULAR | Status: AC
  Administered 2020-09-19: 1 mL via EPIDURAL
  Filled 2020-09-19: qty 10

## 2020-09-19 NOTE — Progress Notes (Signed)
Safety precautions to be maintained throughout the outpatient stay will include: orient to surroundings, keep bed in low position, maintain call bell within reach at all times, provide assistance with transfer out of bed and ambulation.  

## 2020-09-19 NOTE — Progress Notes (Signed)
PROVIDER NOTE: Information contained herein reflects review and annotations entered in association with encounter. Interpretation of such information and data should be left to medically-trained personnel. Information provided to patient can be located elsewhere in the medical record under "Patient Instructions". Document created using STT-dictation technology, any transcriptional errors that may result from process are unintentional.    Patient: Jessica Knight  Service Category: Procedure  Provider: Edward Jolly, MD  DOB: [birth year]  DOS: 09/19/2020  Location: [facility]  MRN: [id number]  Setting: Ambulatory - outpatient  Referring Provider: Jordan Hawks, PA-C  Type: Established Patient  Specialty: Interventional Pain Management  PCP: Jordan Hawks, PA-C   Primary Reason for Visit: Interventional Pain Management Treatment. CC: Neck Pain and Back Pain (low)  Procedure:          Anesthesia, Analgesia, Anxiolysis:  Type: Diagnostic, Inter-Laminar, Cervical Epidural Steroid Injection  #1  Region: Posterior Cervico-thoracic Region Level: C7-T1 Laterality: Right-Sided Paramedial  Type: Moderate (Conscious) Sedation combined with Local Anesthesia Indication(s): Analgesia and Anxiety Route: Intravenous (IV) IV Access: Secured Sedation: Meaningful verbal contact was maintained at all times during the procedure  Local Anesthetic: Lidocaine 1-2%  Position: Prone with head of the table was raised to facilitate breathing.   Indications:  Cervical radicular pain, cervical radiculopathy  Pain Score: Pre-procedure: 7  (neck is a 5)/10 Post-procedure: 0-No pain/10   Pre-op Assessment:  Jessica Knight is a 39 y.o. (year old), female patient, seen today for interventional treatment. She  has a past surgical history that includes Breast surgery; LASIK; and Hernia repair. Jessica Knight has a current medication list which includes the following prescription(s): albuterol sulfate,  budesonide-formoterol, fluticasone, ibuprofen, levothyroxine, lidocaine, multivitamin, cetirizine, ferrous sulfate, labetalol, and prenatal, and the following Facility-Administered Medications: midazolam. Her primarily concern today is the Neck Pain and Back Pain (low)  Initial Vital Signs:  Pulse/HCG Rate: 64ECG Heart Rate: (!) 52 Temp: (!) 97.1 F (36.2 C) Resp: 18 BP: 118/84 SpO2: 98 %  BMI: Estimated body mass index is 30.38 kg/m as calculated from the following:   Height as of this encounter: 5\' 4"  (1.626 m).   Weight as of this encounter: 177 lb (80.3 kg).  Risk Assessment: Allergies: Reviewed. She has No Known Allergies.  Allergy Precautions: None required Coagulopathies: Reviewed. None identified.  Blood-thinner therapy: None at this time Active Infection(s): Reviewed. None identified. Jessica Knight is afebrile  Site Confirmation: Jessica Knight was asked to confirm the procedure and laterality before marking the site Procedure checklist: Completed Consent: Before the procedure and under the influence of no sedative(s), amnesic(s), or anxiolytics, the patient was informed of the treatment options, risks and possible complications. To fulfill our ethical and legal obligations, as recommended by the American Medical Association's Code of Ethics, I have informed the patient of my clinical impression; the nature and purpose of the treatment or procedure; the risks, benefits, and possible complications of the intervention; the alternatives, including doing nothing; the risk(s) and benefit(s) of the alternative treatment(s) or procedure(s); and the risk(s) and benefit(s) of doing nothing. The patient was provided information about the general risks and possible complications associated with the procedure. These may include, but are not limited to: failure to achieve desired goals, infection, bleeding, organ or nerve damage, allergic reactions, paralysis, and death. In addition, the patient was  informed of those risks and complications associated to Spine-related procedures, such as failure to decrease pain; infection (i.e.: Meningitis, epidural or intraspinal abscess); bleeding (i.e.: epidural hematoma, subarachnoid hemorrhage, or  any other type of intraspinal or peri-dural bleeding); organ or nerve damage (i.e.: Any type of peripheral nerve, nerve root, or spinal cord injury) with subsequent damage to sensory, motor, and/or autonomic systems, resulting in permanent pain, numbness, and/or weakness of one or several areas of the body; allergic reactions; (i.e.: anaphylactic reaction); and/or death. Furthermore, the patient was informed of those risks and complications associated with the medications. These include, but are not limited to: allergic reactions (i.e.: anaphylactic or anaphylactoid reaction(s)); adrenal axis suppression; blood sugar elevation that in diabetics may result in ketoacidosis or comma; water retention that in patients with history of congestive heart failure may result in shortness of breath, pulmonary edema, and decompensation with resultant heart failure; weight gain; swelling or edema; medication-induced neural toxicity; particulate matter embolism and blood vessel occlusion with resultant organ, and/or nervous system infarction; and/or aseptic necrosis of one or more joints. Finally, the patient was informed that Medicine is not an exact science; therefore, there is also the possibility of unforeseen or unpredictable risks and/or possible complications that may result in a catastrophic outcome. The patient indicated having understood very clearly. We have given the patient no guarantees and we have made no promises. Enough time was given to the patient to ask questions, all of which were answered to the patient's satisfaction. Jessica Knight has indicated that she wanted to continue with the procedure. Attestation: I, the ordering provider, attest that I have discussed with the  patient the benefits, risks, side-effects, alternatives, likelihood of achieving goals, and potential problems during recovery for the procedure that I have provided informed consent. Date  Time: 09/19/2020  8:11 AM  Pre-Procedure Preparation:  Monitoring: As per clinic protocol. Respiration, ETCO2, SpO2, BP, heart rate and rhythm monitor placed and checked for adequate function Safety Precautions: Patient was assessed for positional comfort and pressure points before starting the procedure. Time-out: I initiated and conducted the "Time-out" before starting the procedure, as per protocol. The patient was asked to participate by confirming the accuracy of the "Time Out" information. Verification of the correct person, site, and procedure were performed and confirmed by me, the nursing staff, and the patient. "Time-out" conducted as per Joint Commission's Universal Protocol (UP.01.01.01). Time: 0912  Description of Procedure:          Target Area: For Epidural Steroid injections the target is the interlaminar space, initially targeting the lower border of the superior vertebral body lamina. Approach: Paramedial approach. Area Prepped: Entire PosteriorCervical Region DuraPrep (Iodine Povacrylex [0.7% available iodine] and Isopropyl Alcohol, 74% w/w) Safety Precautions: Aspiration looking for blood return was conducted prior to all injections. At no point did we inject any substances, as a needle was being advanced. No attempts were made at seeking any paresthesias. Safe injection practices and needle disposal techniques used. Medications properly checked for expiration dates. SDV (single dose vial) medications used. Description of the Procedure: Protocol guidelines were followed. The procedure needle was introduced through the skin, ipsilateral to the reported pain, and advanced to the target area. Bone was contacted and the needle walked caudad, until the lamina was cleared. The epidural space was  identified using "loss-of-resistance technique" with 2-3 ml of PF-NaCl (0.9% NSS), in a 5cc LOR glass syringe. Vitals:   09/19/20 0930 09/19/20 0940 09/19/20 0950 09/19/20 1000  BP: 122/83 116/80 102/74 115/76  Pulse: (!) 54     Resp: 11 12 15 15   Temp:      TempSrc:      SpO2: 100% 100% 100% 100%  Weight:      Height:        Start Time: 0912 hrs. End Time: 0930 hrs. Materials:  Needle(s) Type: Epidural needle Gauge: 22G Length: 3.5-in Medication(s): Please see orders for medications and dosing details. 4 cc solution made of 2 cc of preservative-free saline, 1 cc of 0.2% ropivacaine, 1 cc of Decadron 10 mg/cc.  Imaging Guidance (Spinal):          Type of Imaging Technique: Fluoroscopy Guidance (Spinal) Indication(s): Assistance in needle guidance and placement for procedures requiring needle placement in or near specific anatomical locations not easily accessible without such assistance. Exposure Time: Please see nurses notes. Contrast: Before injecting any contrast, we confirmed that the patient did not have an allergy to iodine, shellfish, or radiological contrast. Once satisfactory needle placement was completed at the desired level, radiological contrast was injected. Contrast injected under live fluoroscopy. No contrast complications. See chart for type and volume of contrast used. Fluoroscopic Guidance: I was personally present during the use of fluoroscopy. "Tunnel Vision Technique" used to obtain the best possible view of the target area. Parallax error corrected before commencing the procedure. "Direction-depth-direction" technique used to introduce the needle under continuous pulsed fluoroscopy. Once target was reached, antero-posterior, oblique, and lateral fluoroscopic projection used confirm needle placement in all planes. Images permanently stored in EMR. Interpretation: I personally interpreted the imaging intraoperatively. Adequate needle placement confirmed in multiple  planes. Appropriate spread of contrast into desired area was observed. No evidence of afferent or efferent intravascular uptake. No intrathecal or subarachnoid spread observed. Permanent images saved into the patient's record.  Antibiotic Prophylaxis:   Anti-infectives (From admission, onward)   None     Indication(s): None identified  Post-operative Assessment:  Post-procedure Vital Signs:  Pulse/HCG Rate: (!) 54(!) 55 Temp: (!) 97.1 F (36.2 C) Resp: 15 BP: 115/76 SpO2: 100 %  EBL: None  Complications: No immediate post-treatment complications observed by team, or reported by patient.  Note: The patient tolerated the entire procedure well. A repeat set of vitals were taken after the procedure and the patient was kept under observation following institutional policy, for this type of procedure. Post-procedural neurological assessment was performed, showing return to baseline, prior to discharge. The patient was provided with post-procedure discharge instructions, including a section on how to identify potential problems. Should any problems arise concerning this procedure, the patient was given instructions to immediately contact us, at any time, without hesitation. In any case, we plan to contact the patient by telephone for a follow-up status report regarding this interventional procedure.  Comments:  No additional relevant information.  5 out of 5 strength bilateral upper extremity: Shoulder abduction, elbow flexion, elbow extension, thumb extension.   Plan of Care  Orders:  Orders Placed This Encounter  Procedures  . DG PAIN CLINIC C-ARM 1-60 MIN NO REPORT    Intraoperative interpretation by procedural physician at [facility].    Standing Status:   Standing    Number of Occurrences:   1    Order Specific Question:   Reason for exam:    Answer:   Assistance in needle guidance and placement for procedures requiring needle placement in or near specific anatomical  locations not easily accessible without such assistance.   Medications ordered for procedure: Meds ordered this encounter  Medications  . lidocaine (XYLOCAINE) 2 % (with pres) injection 400 mg  . iohexol (OMNIPAQUE) 180 MG/ML injection 10 mL    Must be Myelogram-compatible. If not available, you may  substitute with a water-soluble, non-ionic, hypoallergenic, myelogram-compatible radiological contrast medium.  . fentaNYL (SUBLIMAZE) injection 25-50 mcg    Make sure Narcan is available in the pyxis when using this medication. In the event of respiratory depression (RR< 8/min): Titrate NARCAN (naloxone) in increments of 0.1 to 0.2 mg IV at 2-3 minute intervals, until desired degree of reversal.  . midazolam (VERSED) 5 MG/5ML injection 1-2 mg    Make sure Flumazenil is available in the pyxis when using this medication. If oversedation occurs, administer 0.2 mg IV over 15 sec. If after 45 sec no response, administer 0.2 mg again over 1 min; may repeat at 1 min intervals; not to exceed 4 doses (1 mg)  . ropivacaine (PF) 2 mg/mL (0.2%) (NAROPIN) injection 1 mL  . sodium chloride flush (NS) 0.9 % injection 1 mL  . dexamethasone (DECADRON) injection 10 mg  . methylPREDNISolone acetate (DEPO-MEDROL) injection 40 mg  . methylPREDNISolone acetate (DEPO-MEDROL) injection 40 mg  . ropivacaine (PF) 2 mg/mL (0.2%) (NAROPIN) injection 9 mL   Medications administered: We administered lidocaine, iohexol, fentaNYL, midazolam, ropivacaine (PF) 2 mg/mL (0.2%), sodium chloride flush, dexamethasone, methylPREDNISolone acetate, methylPREDNISolone acetate, and ropivacaine (PF) 2 mg/mL (0.2%).  See the medical record for exact dosing, route, and time of administration.  Follow-up plan:   Return in about 4 weeks (around 10/17/2020) for Post Procedure Evaluation.      Right C7-T1 ESI, bilateral sacroiliac joint injection 09/19/2020   Recent Visits Date Type Provider Dept  08/27/20 Office Visit Edward Jolly, MD  [facility]  Showing recent visits within past 90 days and meeting all other requirements Today's Visits Date Type Provider Dept  09/19/20 Procedure visit Edward Jolly, MD [facility]  Showing today's visits and meeting all other requirements Future Appointments Date Type Provider Dept  10/17/20 Appointment Edward Jolly, MD [facility]  Showing future appointments within next 90 days and meeting all other requirements  Disposition: Discharge home  Discharge (Date  Time): 09/19/2020; 1000 hrs.   Primary Care Physician: Barbarann Ehlers Location: [facility] Note by: Edward Jolly, MD Date: 09/19/2020; Time: 1:04 PM  Disclaimer:  Medicine is not an exact science. The only guarantee in medicine is that nothing is guaranteed. It is important to note that the decision to proceed with this intervention was based on the information collected from the patient. The Data and conclusions were drawn from the patient's questionnaire, the interview, and the physical examination. Because the information was provided in large part by the patient, it cannot be guaranteed that it has not been purposely or unconsciously manipulated. Every effort has been made to obtain as much relevant data as possible for this evaluation. It is important to note that the conclusions that lead to this procedure are derived in large part from the available data. Always take into account that the treatment will also be dependent on availability of resources and existing treatment guidelines, considered by other Pain Management Practitioners as being common knowledge and practice, at the time of the intervention. For Medico-Legal purposes, it is also important to point out that variation in procedural techniques and pharmacological choices are the acceptable norm. The indications, contraindications, technique, and results of the above procedure should only be  interpreted and judged by a Board-Certified Interventional Pain Specialist with extensive familiarity and expertise in the same exact procedure and technique.

## 2020-09-19 NOTE — Patient Instructions (Signed)
Pain Management Discharge Instructions  General Discharge Instructions :  If you need to reach your doctor call: Monday-Friday 8:00 am - 4:00 pm at [phone] or toll free [phone].  After clinic hours [phone] to have operator reach doctor.  Bring all of your medication bottles to all your appointments in the pain clinic.  To cancel or reschedule your appointment with Pain Management please remember to call 24 hours in advance to avoid a fee.  Refer to the educational materials which you have been given on: General Risks, I had my Procedure. Discharge Instructions, Post Sedation.  Post Procedure Instructions:  The drugs you were given will stay in your system until tomorrow, so for the next 24 hours you should not drive, make any legal decisions or drink any alcoholic beverages.  You may eat anything you prefer, but it is better to start with liquids then soups and crackers, and gradually work up to solid foods.  Please notify your doctor immediately if you have any unusual bleeding, trouble breathing or pain that is not related to your normal pain.  Depending on the type of procedure that was done, some parts of your body may feel week and/or numb.  This usually clears up by tonight or the next day.  Walk with the use of an assistive device or accompanied by an adult for the 24 hours.  You may use ice on the affected area for the first 24 hours.  Put ice in a Ziploc bag and cover with a towel and place against area 15 minutes on 15 minutes off.  You may switch to heat after 24 hours.Sacroiliac (SI) Joint Injection Patient Information  Description: The sacroiliac joint connects the scrum (very low back and tailbone) to the ilium (a pelvic bone which also forms half of the hip joint).  Normally this joint experiences very little motion.  When this joint becomes inflamed or unstable low back and or hip and pelvis pain may result.  Injection of this joint with local anesthetics  (numbing medicines) and steroids can provide diagnostic information and reduce pain.  This injection is performed with the aid of x-ray guidance into the tailbone area while you are lying on your stomach.   You may experience an electrical sensation down the leg while this is being done.  You may also experience numbness.  We also may ask if we are reproducing your normal pain during the injection.  Conditions which may be treated SI injection:   Low back, buttock, hip or leg pain  Preparation for the Injection:  1. Do not eat any solid food or dairy products within 8 hours of your appointment.  2. You may drink clear liquids up to 3 hours before appointment.  Clear liquids include water, black coffee, juice or soda.  No milk or cream please. 3. You may take your regular medications, including pain medications with a sip of water before your appointment.  Diabetics should hold regular insulin (if take separately) and take 1/2 normal NPH dose the morning of the procedure.  Carry some sugar containing items with you to your appointment. 4. A driver must accompany you and be prepared to drive you home after your procedure. 5. Bring all of your current medications with you. 6. An IV may be inserted and sedation may be given at the discretion of the physician. 7. A blood pressure cuff, EKG and other monitors will often be applied during the procedure.  Some patients may need to have extra oxygen   administered for a short period.  8. You will be asked to provide medical information, including your allergies, prior to the procedure.  We must know immediately if you are taking blood thinners (like Coumadin/Warfarin) or if you are allergic to IV iodine contrast (dye).  We must know if you could possible be pregnant.  Possible side effects:   Bleeding from needle site  Infection (rare, may require surgery)  Nerve injury (rare)  Numbness & tingling (temporary)  A brief convulsion or  seizure  Light-headedness (temporary)  Pain at injection site (several days)  Decreased blood pressure (temporary)  Weakness in the leg (temporary)   Call if you experience:   New onset weakness or numbness of an extremity below the injection site that last more than 8 hours.  Hives or difficulty breathing ( go to the emergency room)  Inflammation or drainage at the injection site  Any new symptoms which are concerning to you  Please note:  Although the local anesthetic injected can often make your back/ hip/ buttock/ leg feel good for several hours after the injections, the pain will likely return.  It takes 3-7 days for steroids to work in the sacroiliac area.  You may not notice any pain relief for at least that one week.  If effective, we will often do a series of three injections spaced 3-6 weeks apart to maximally decrease your pain.  After the initial series, we generally will wait some months before a repeat injection of the same type.  If you have any questions, please call [phone] [facility]  Epidural Steroid Injection Patient Information  Description: The epidural space surrounds the nerves as they exit the spinal cord.  In some patients, the nerves can be compressed and inflamed by a bulging disc or a tight spinal canal (spinal stenosis).  By injecting steroids into the epidural space, we can bring irritated nerves into direct contact with a potentially helpful medication.  These steroids act directly on the irritated nerves and can reduce swelling and inflammation which often leads to decreased pain.  Epidural steroids may be injected anywhere along the spine and from the neck to the low back depending upon the location of your pain.   After numbing the skin with local anesthetic (like Novocaine), a small needle is passed into the epidural space slowly.  You may experience a sensation of pressure while this is being done.  The  entire block usually last less than 10 minutes.  Conditions which may be treated by epidural steroids:   Low back and leg pain  Neck and arm pain  Spinal stenosis  Post-laminectomy syndrome  Herpes zoster (shingles) pain  Pain from compression fractures  Preparation for the injection:  9. Do not eat any solid food or dairy products within 8 hours of your appointment.  10. You may drink clear liquids up to 3 hours before appointment.  Clear liquids include water, black coffee, juice or soda.  No milk or cream please. 11. You may take your regular medication, including pain medications, with a sip of water before your appointment  Diabetics should hold regular insulin (if taken separately) and take 1/2 normal NPH dos the morning of the procedure.  Carry some sugar containing items with you to your appointment. 12. A driver must accompany you and be prepared to drive you home after your procedure.  13. Bring all your current medications with your. 14. An IV may be inserted and sedation may  be given at the discretion of the physician.   15. A blood pressure cuff, EKG and other monitors will often be applied during the procedure.  Some patients may need to have extra oxygen administered for a short period. 16. You will be asked to provide medical information, including your allergies, prior to the procedure.  We must know immediately if you are taking blood thinners (like Coumadin/Warfarin)  Or if you are allergic to IV iodine contrast (dye). We must know if you could possible be pregnant.  Possible side-effects:  Bleeding from needle site  Infection (rare, may require surgery)  Nerve injury (rare)  Numbness & tingling (temporary)  Difficulty urinating (rare, temporary)  Spinal headache ( a headache worse with upright posture)  Light -headedness (temporary)  Pain at injection site (several days)  Decreased blood pressure (temporary)  Weakness in arm/leg  (temporary)  Pressure sensation in back/neck (temporary)  Call if you experience:  Fever/chills associated with headache or increased back/neck pain.  Headache worsened by an upright position.  New onset weakness or numbness of an extremity below the injection site  Hives or difficulty breathing (go to the emergency room)  Inflammation or drainage at the infection site  Severe back/neck pain  Any new symptoms which are concerning to you  Please note:  Although the local anesthetic injected can often make your back or neck feel good for several hours after the injection, the pain will likely return.  It takes 3-7 days for steroids to work in the epidural space.  You may not notice any pain relief for at least that one week.  If effective, we will often do a series of three injections spaced 3-6 weeks apart to maximally decrease your pain.  After the initial series, we generally will wait several months before considering a repeat injection of the same type.  If you have any questions, please call [phone] [facility]

## 2020-09-19 NOTE — Progress Notes (Signed)
PROVIDER NOTE: Information contained herein reflects review and annotations entered in association with encounter. Interpretation of such information and data should be left to medically-trained personnel. Information provided to patient can be located elsewhere in the medical record under "Patient Instructions". Document created using STT-dictation technology, any transcriptional errors that may result from process are unintentional.    Patient: Jessica Knight  Service Category: Procedure  Provider: Edward Jolly, MD  DOB: 1980/12/20  DOS: 09/19/2020  Location: [facility]  MRN: [id number]  Setting: Ambulatory - outpatient  Referring Provider: Jordan Hawks, PA-C  Type: Established Patient  Specialty: Interventional Pain Management  PCP: Jordan Hawks, PA-C   Primary Reason for Visit: Interventional Pain Management Treatment. CC: Neck Pain and Back Pain (low)  Procedure:          Anesthesia, Analgesia, Anxiolysis:  Type: Diagnostic Sacroiliac Joint Steroid Injection          Region: Inferior Lumbosacral Region Level: PIIS (Posterior Inferior Iliac Spine) Laterality: Bilateral  Type: Moderate (Conscious) Sedation combined with Local Anesthesia Indication(s): Analgesia and Anxiety Route: Intravenous (IV) IV Access: Secured Sedation: Meaningful verbal contact was maintained at all times during the procedure  Local Anesthetic: Lidocaine 1-2%  Position: Prone           Indications: Bilateral sacroiliac joint pain, SI joint dysfunction  Pain Score: Pre-procedure: 7  (neck is a 5)/10 Post-procedure: 0-No pain/10   Pre-op Assessment:  Jessica Knight is a 39 y.o. (year old), female patient, seen today for interventional treatment. She  has a past surgical history that includes Breast surgery; LASIK; and Hernia repair. Jessica Knight has a current medication list which includes the following prescription(s): albuterol sulfate, budesonide-formoterol, fluticasone, ibuprofen,  levothyroxine, lidocaine, multivitamin, cetirizine, ferrous sulfate, labetalol, and prenatal, and the following Facility-Administered Medications: midazolam. Her primarily concern today is the Neck Pain and Back Pain (low)  Initial Vital Signs:  Pulse/HCG Rate: 64ECG Heart Rate: (!) 52 Temp: (!) 97.1 F (36.2 C) Resp: 18 BP: 118/84 SpO2: 98 %  BMI: Estimated body mass index is 30.38 kg/m as calculated from the following:   Height as of this encounter: 5\' 4"  (1.626 m).   Weight as of this encounter: 177 lb (80.3 kg).  Risk Assessment: Allergies: Reviewed. She has No Known Allergies.  Allergy Precautions: None required Coagulopathies: Reviewed. None identified.  Blood-thinner therapy: None at this time Active Infection(s): Reviewed. None identified. Jessica Knight is afebrile  Site Confirmation: Jessica Knight was asked to confirm the procedure and laterality before marking the site Procedure checklist: Completed Consent: Before the procedure and under the influence of no sedative(s), amnesic(s), or anxiolytics, the patient was informed of the treatment options, risks and possible complications. To fulfill our ethical and legal obligations, as recommended by the American Medical Association's Code of Ethics, I have informed the patient of my clinical impression; the nature and purpose of the treatment or procedure; the risks, benefits, and possible complications of the intervention; the alternatives, including doing nothing; the risk(s) and benefit(s) of the alternative treatment(s) or procedure(s); and the risk(s) and benefit(s) of doing nothing. The patient was provided information about the general risks and possible complications associated with the procedure. These may include, but are not limited to: failure to achieve desired goals, infection, bleeding, organ or nerve damage, allergic reactions, paralysis, and death. In addition, the patient was informed of those risks and complications  associated to the procedure, such as failure to decrease pain; infection; bleeding; organ or nerve damage with  subsequent damage to sensory, motor, and/or autonomic systems, resulting in permanent pain, numbness, and/or weakness of one or several areas of the body; allergic reactions; (i.e.: anaphylactic reaction); and/or death. Furthermore, the patient was informed of those risks and complications associated with the medications. These include, but are not limited to: allergic reactions (i.e.: anaphylactic or anaphylactoid reaction(s)); adrenal axis suppression; blood sugar elevation that in diabetics may result in ketoacidosis or comma; water retention that in patients with history of congestive heart failure may result in shortness of breath, pulmonary edema, and decompensation with resultant heart failure; weight gain; swelling or edema; medication-induced neural toxicity; particulate matter embolism and blood vessel occlusion with resultant organ, and/or nervous system infarction; and/or aseptic necrosis of one or more joints. Finally, the patient was informed that Medicine is not an exact science; therefore, there is also the possibility of unforeseen or unpredictable risks and/or possible complications that may result in a catastrophic outcome. The patient indicated having understood very clearly. We have given the patient no guarantees and we have made no promises. Enough time was given to the patient to ask questions, all of which were answered to the patient's satisfaction. Jessica Knight has indicated that she wanted to continue with the procedure. Attestation: I, the ordering provider, attest that I have discussed with the patient the benefits, risks, side-effects, alternatives, likelihood of achieving goals, and potential problems during recovery for the procedure that I have provided informed consent. Date  Time: 09/19/2020  8:11 AM  Pre-Procedure Preparation:  Monitoring: As per clinic protocol.  Respiration, ETCO2, SpO2, BP, heart rate and rhythm monitor placed and checked for adequate function Safety Precautions: Patient was assessed for positional comfort and pressure points before starting the procedure. Time-out: I initiated and conducted the "Time-out" before starting the procedure, as per protocol. The patient was asked to participate by confirming the accuracy of the "Time Out" information. Verification of the correct person, site, and procedure were performed and confirmed by me, the nursing staff, and the patient. "Time-out" conducted as per Joint Commission's Universal Protocol (UP.01.01.01). Time: 0912  Description of Procedure:          Target Area: Inferior, posterior, aspect of the sacroiliac fissure Approach: Posterior, paraspinal, ipsilateral approach. Area Prepped: Entire Lower Lumbosacral Region DuraPrep (Iodine Povacrylex [0.7% available iodine] and Isopropyl Alcohol, 74% w/w) Safety Precautions: Aspiration looking for blood return was conducted prior to all injections. At no point did we inject any substances, as a needle was being advanced. No attempts were made at seeking any paresthesias. Safe injection practices and needle disposal techniques used. Medications properly checked for expiration dates. SDV (single dose vial) medications used. Description of the Procedure: Protocol guidelines were followed. The patient was placed in position over the procedure table. The target area was identified and the area prepped in the usual manner. Skin & deeper tissues infiltrated with local anesthetic. Appropriate amount of time allowed to pass for local anesthetics to take effect. The procedure needle was advanced under fluoroscopic guidance into the sacroiliac joint until a firm endpoint was obtained. Proper needle placement secured. Negative aspiration confirmed. Solution injected in intermittent fashion, asking for systemic symptoms every 0.5cc of injectate. The needles were then  removed and the area cleansed, making sure to leave some of the prepping solution back to take advantage of its long term bactericidal properties. Vitals:   09/19/20 0930 09/19/20 0940 09/19/20 0950 09/19/20 1000  BP: 122/83 116/80 102/74 115/76  Pulse: (!) 54     Resp: 11  12 15 15   Temp:      TempSrc:      SpO2: 100% 100% 100% 100%  Weight:      Height:        Start Time: 0912 hrs. End Time: 0930 hrs. Materials:  Needle(s) Type: Spinal Needle Gauge: 22G Length: 3.5-in Medication(s): Please see orders for medications and dosing details. 10 cc solution made of 8 cc of 0.2% ropivacaine, 2 cc of methylprednisolone, 40 mg/cc. 2.5 cc injected intra-articular, 2.5 cc injected periarticular, left SI joint 2.5 cc injected intra-articular, 2.5 cc injected periarticular, right SI joint Imaging Guidance (Non-Spinal):          Type of Imaging Technique: Fluoroscopy Guidance (Non-Spinal) Indication(s): Assistance in needle guidance and placement for procedures requiring needle placement in or near specific anatomical locations not easily accessible without such assistance. Exposure Time: Please see nurses notes. Contrast: Before injecting any contrast, we confirmed that the patient did not have an allergy to iodine, shellfish, or radiological contrast. Once satisfactory needle placement was completed at the desired level, radiological contrast was injected. Contrast injected under live fluoroscopy. No contrast complications. See chart for type and volume of contrast used. Fluoroscopic Guidance: I was personally present during the use of fluoroscopy. "Tunnel Vision Technique" used to obtain the best possible view of the target area. Parallax error corrected before commencing the procedure. "Direction-depth-direction" technique used to introduce the needle under continuous pulsed fluoroscopy. Once target was reached, antero-posterior, oblique, and lateral fluoroscopic projection used confirm needle  placement in all planes. Images permanently stored in EMR. Interpretation: I personally interpreted the imaging intraoperatively. Adequate needle placement confirmed in multiple planes. Appropriate spread of contrast into desired area was observed. No evidence of afferent or efferent intravascular uptake. Permanent images saved into the patient's record.  Antibiotic Prophylaxis:   Anti-infectives (From admission, onward)   None     Indication(s): None identified  Post-operative Assessment:  Post-procedure Vital Signs:  Pulse/HCG Rate: (!) 54(!) 55 Temp: (!) 97.1 F (36.2 C) Resp: 15 BP: 115/76 SpO2: 100 %  EBL: None  Complications: No immediate post-treatment complications observed by team, or reported by patient.  Note: The patient tolerated the entire procedure well. A repeat set of vitals were taken after the procedure and the patient was kept under observation following institutional policy, for this type of procedure. Post-procedural neurological assessment was performed, showing return to baseline, prior to discharge. The patient was provided with post-procedure discharge instructions, including a section on how to identify potential problems. Should any problems arise concerning this procedure, the patient was given instructions to immediately contact us, at any time, without hesitation. In any case, we plan to contact the patient by telephone for a follow-up status report regarding this interventional procedure.  Comments:  No additional relevant information.  Plan of Care  Orders:  Orders Placed This Encounter  Procedures  . DG PAIN CLINIC C-ARM 1-60 MIN NO REPORT    Intraoperative interpretation by procedural physician at [facility].    Standing Status:   Standing    Number of Occurrences:   1    Order Specific Question:   Reason for exam:    Answer:   Assistance in needle guidance and placement for procedures requiring needle placement in or near specific  anatomical locations not easily accessible without such assistance.    Medications ordered for procedure: Meds ordered this encounter  Medications  . lidocaine (XYLOCAINE) 2 % (with pres) injection 400 mg  . iohexol (OMNIPAQUE) 180  MG/ML injection 10 mL    Must be Myelogram-compatible. If not available, you may substitute with a water-soluble, non-ionic, hypoallergenic, myelogram-compatible radiological contrast medium.  . fentaNYL (SUBLIMAZE) injection 25-50 mcg    Make sure Narcan is available in the pyxis when using this medication. In the event of respiratory depression (RR< 8/min): Titrate NARCAN (naloxone) in increments of 0.1 to 0.2 mg IV at 2-3 minute intervals, until desired degree of reversal.  . midazolam (VERSED) 5 MG/5ML injection 1-2 mg    Make sure Flumazenil is available in the pyxis when using this medication. If oversedation occurs, administer 0.2 mg IV over 15 sec. If after 45 sec no response, administer 0.2 mg again over 1 min; may repeat at 1 min intervals; not to exceed 4 doses (1 mg)  . ropivacaine (PF) 2 mg/mL (0.2%) (NAROPIN) injection 1 mL  . sodium chloride flush (NS) 0.9 % injection 1 mL  . dexamethasone (DECADRON) injection 10 mg  . methylPREDNISolone acetate (DEPO-MEDROL) injection 40 mg  . methylPREDNISolone acetate (DEPO-MEDROL) injection 40 mg  . ropivacaine (PF) 2 mg/mL (0.2%) (NAROPIN) injection 9 mL   Medications administered: We administered lidocaine, iohexol, fentaNYL, midazolam, ropivacaine (PF) 2 mg/mL (0.2%), sodium chloride flush, dexamethasone, methylPREDNISolone acetate, methylPREDNISolone acetate, and ropivacaine (PF) 2 mg/mL (0.2%).  See the medical record for exact dosing, route, and time of administration.  Follow-up plan:   Return in about 4 weeks (around 10/17/2020) for Post Procedure Evaluation.    Recent Visits Date Type Provider Dept  08/27/20 Office Visit Edward Jolly, MD [facility]  Showing recent visits within past 90  days and meeting all other requirements Today's Visits Date Type Provider Dept  09/19/20 Procedure visit Edward Jolly, MD [facility]  Showing today's visits and meeting all other requirements Future Appointments Date Type Provider Dept  10/17/20 Appointment Edward Jolly, MD [facility]  Showing future appointments within next 90 days and meeting all other requirements  Disposition: Discharge home  Discharge (Date  Time): 09/19/2020; 1000 hrs.   Primary Care Physician: Barbarann Ehlers Location: [facility] Note by: Edward Jolly, MD Date: 09/19/2020; Time: 1:08 PM  Disclaimer:  Medicine is not an exact science. The only guarantee in medicine is that nothing is guaranteed. It is important to note that the decision to proceed with this intervention was based on the information collected from the patient. The Data and conclusions were drawn from the patient's questionnaire, the interview, and the physical examination. Because the information was provided in large part by the patient, it cannot be guaranteed that it has not been purposely or unconsciously manipulated. Every effort has been made to obtain as much relevant data as possible for this evaluation. It is important to note that the conclusions that lead to this procedure are derived in large part from the available data. Always take into account that the treatment will also be dependent on availability of resources and existing treatment guidelines, considered by other Pain Management Practitioners as being common knowledge and practice, at the time of the intervention. For Medico-Legal purposes, it is also important to point out that variation in procedural techniques and pharmacological choices are the acceptable norm. The indications, contraindications, technique, and results of the above procedure should only be interpreted and judged by a Board-Certified Interventional Pain  Specialist with extensive familiarity and expertise in the same exact procedure and technique.

## 2020-09-20 ENCOUNTER — Telehealth: Payer: Self-pay

## 2020-09-20 NOTE — Telephone Encounter (Signed)
Post procedure follow up.  Patient states that she is having some of the same pain in her right leg that she was having previously.  States she did good while the numbing meds were in place but it came back.  Informed her that it could take several days for the steroid to take effect.  Informed her to document specifically on the pain diary and to call us if she had any further questions or concerns.

## 2020-10-08 ENCOUNTER — Telehealth: Payer: Self-pay

## 2020-10-08 NOTE — Telephone Encounter (Signed)
Spoke with patient and she states that her back is on fire.  States that her low back is worse than before the procedure on 09-18-2020 (Bilateral SIJI) States she got 100% first day of procedure then noo relief after that.  States the pain radiates down the front of left leg to ankle.  Patient has a follow up appointment on 10-17-2020 but she requests an earlier appointment.  Please see if we can get her a virtual appointment as soon as possible.  Thank you

## 2020-10-08 NOTE — Telephone Encounter (Signed)
She wants a nurse to call her back regarding her procedure

## 2020-10-09 ENCOUNTER — Encounter: Payer: Self-pay | Admitting: Student in an Organized Health Care Education/Training Program

## 2020-10-09 ENCOUNTER — Other Ambulatory Visit: Payer: Self-pay

## 2020-10-09 ENCOUNTER — Ambulatory Visit
Payer: No Typology Code available for payment source | Attending: Student in an Organized Health Care Education/Training Program | Admitting: Student in an Organized Health Care Education/Training Program

## 2020-10-09 DIAGNOSIS — M5412 Radiculopathy, cervical region: Secondary | ICD-10-CM

## 2020-10-09 NOTE — Progress Notes (Signed)
I attempted to call the patient however no response. Voicemail left instructing patient to call front desk office at [phone] to reschedule appointment. -Dr Cherylann Ratel    Post-Procedure Evaluation  Procedure (09/19/2020): Bilateral SI-J + Right C7-T1 ESI  Sedation: Please see nurses note.  Effectiveness during initial hour after procedure(Ultra-Short Term Relief): 80 %   Local anesthetic used: Long-acting (4-6 hours) Effectiveness: Defined as any analgesic benefit obtained secondary to the administration of local anesthetics. This carries significant diagnostic value as to the etiological location, or anatomical origin, of the pain. Duration of benefit is expected to coincide with the duration of the local anesthetic used.  Effectiveness during initial 4-6 hours after procedure(Short-Term Relief): 80 %   Long-term benefit: Defined as any relief past the pharmacologic duration of the local anesthetics.  Effectiveness past the initial 6 hours after procedure(Long-Term Relief): 0 %

## 2020-10-17 ENCOUNTER — Telehealth
Payer: No Typology Code available for payment source | Admitting: Student in an Organized Health Care Education/Training Program

## 2020-12-06 ENCOUNTER — Ambulatory Visit: Payer: No Typology Code available for payment source | Admitting: Dermatology

## 2021-04-22 ENCOUNTER — Other Ambulatory Visit: Payer: Self-pay

## 2021-04-22 ENCOUNTER — Encounter: Payer: Self-pay | Admitting: Student in an Organized Health Care Education/Training Program

## 2021-04-22 ENCOUNTER — Ambulatory Visit
Attending: Student in an Organized Health Care Education/Training Program | Admitting: Student in an Organized Health Care Education/Training Program

## 2021-04-22 VITALS — BP 109/81 | HR 67 | Temp 98.5°F | Resp 16 | Ht 64.0 in | Wt 172.0 lb

## 2021-04-22 DIAGNOSIS — M5136 Other intervertebral disc degeneration, lumbar region: Secondary | ICD-10-CM | POA: Insufficient documentation

## 2021-04-22 DIAGNOSIS — M4802 Spinal stenosis, cervical region: Secondary | ICD-10-CM | POA: Diagnosis present

## 2021-04-22 DIAGNOSIS — M5412 Radiculopathy, cervical region: Secondary | ICD-10-CM

## 2021-04-22 DIAGNOSIS — G894 Chronic pain syndrome: Secondary | ICD-10-CM | POA: Diagnosis present

## 2021-04-22 DIAGNOSIS — M47818 Spondylosis without myelopathy or radiculopathy, sacral and sacrococcygeal region: Secondary | ICD-10-CM | POA: Insufficient documentation

## 2021-04-22 DIAGNOSIS — M533 Sacrococcygeal disorders, not elsewhere classified: Secondary | ICD-10-CM | POA: Diagnosis present

## 2021-04-22 DIAGNOSIS — M5416 Radiculopathy, lumbar region: Secondary | ICD-10-CM | POA: Insufficient documentation

## 2021-04-22 NOTE — Progress Notes (Signed)
PROVIDER NOTE: Information contained herein reflects review and annotations entered in association with encounter. Interpretation of such information and data should be left to medically-trained personnel. Information provided to patient can be located elsewhere in the medical record under "Patient Instructions". Document created using STT-dictation technology, any transcriptional errors that may result from process are unintentional.    Patient: Jessica Knight  Service Category: E/M  Provider: Gillis Santa, MD  DOB: 14-Feb-1981  DOS: 04/22/2021  Specialty: Interventional Pain Management  MRN: [id number]  Setting: Ambulatory outpatient  PCP: Mindi Curling, PA-C  Type: Established Patient    Referring Provider: Juanda Chance  Location: Office  Delivery: Face-to-face     HPI  Jessica Knight, a 40 y.o. year old female, is here today because of her Cervical radicular pain [M54.12]. Jessica Knight primary complain today is Back Pain (lower) Last encounter: My last encounter with her was on 09/19/2020 for cervical epidural steroid injection, bilateral sacroiliac joint injection Pertinent problems: Jessica Knight has Sacroiliac joint pain; SI joint arthritis; Lumbar degenerative disc disease; Lumbar radiculopathy (Right L5 EMG/NCV); Chronic pain syndrome; Cervical radicular pain (right C5/6/7); Bilateral carpal tunnel syndrome; and Foraminal stenosis of cervical region on their pertinent problem list. Pain Assessment: Severity of Chronic pain is reported as a 6 /10. Location: Back Lower/down right leg to foot, sometimes includes toes. Onset: More than a month ago. Quality: Sharp,Shooting. Timing: Constant. Modifying factor(s): meloxicam, ibuprofen, cold pack. Vitals:  height is '5\' 4"'  (1.626 m) and weight is 172 lb (78 kg). Her temporal temperature is 98.5 F (36.9 C). Her blood pressure is 109/81 and her pulse is 67. Her respiration is 16 and oxygen saturation is 98%.   Reason for encounter:  medication management.    Patient's last visit with me was in October 2021.  At that time she received a right C7-T1 cervical epidural steroid injection as well as bilateral sacroiliac joint injection.  She states that these procedures were helpful for approximately 2 weeks.  She states that she has had some issues getting back into the pain clinic to see me.  She is having worsening low back pain as well as neck pain that radiates into her left upper extremity.  She has seen the [place] for this.  They did an MRI of her cervical and lumbar spine sometime last month.  I requested the patient get [place] those records so that I can review and that we can discuss treatment plan going forward.  Patient endorsed understanding.  ROS  Constitutional: Denies any fever or chills Gastrointestinal: No reported hemesis, hematochezia, vomiting, or acute GI distress Musculoskeletal: Neck pain, right arm pain, low back pain Neurological: No reported episodes of acute onset apraxia, aphasia, dysarthria, agnosia, amnesia, paralysis, loss of coordination, or loss of consciousness  Medication Review  Albuterol Sulfate, budesonide-formoterol, cetirizine, fluticasone, ibuprofen, levothyroxine, lidocaine, and multivitamin  History Review  Allergy: Jessica Knight has No Known Allergies. Drug: Jessica Knight  reports previous drug use. Alcohol:  reports previous alcohol use. Tobacco:  reports that she has never smoked. She has never used smokeless tobacco. Social: Jessica Knight  reports that she has never smoked. She has never used smokeless tobacco. She reports previous alcohol use. She reports previous drug use. Medical:  has a past medical history of Asthma, Diabetes mellitus without complication ([place]), and Thyroid disease. Surgical: Jessica Knight  has a past surgical history that includes Breast surgery; LASIK; and Hernia repair. Family: family history includes Breast cancer in her maternal aunt;  Diabetes in her maternal  grandmother.  Laboratory Chemistry Profile   Renal Lab Results  Component Value Date   BUN 12 05/21/2020   CREATININE 1.00 05/21/2020   LABCREA 33 05/18/2020   GFRAA >60 05/21/2020   GFRNONAA >60 05/21/2020     Hepatic Lab Results  Component Value Date   AST 37 05/21/2020   ALT 16 05/21/2020   ALBUMIN 2.3 (L) 05/21/2020   ALKPHOS 146 (H) 05/21/2020     Electrolytes Lab Results  Component Value Date   NA 136 05/21/2020   K 3.8 05/21/2020   CL 109 05/21/2020   CALCIUM 7.4 (L) 05/21/2020   MG 5.8 (H) 05/20/2020     Bone No results found for: VD25OH, VD125OH2TOT, HE1740CX4, GY1856DJ4, 25OHVITD1, 25OHVITD2, 25OHVITD3, TESTOFREE, TESTOSTERONE   Inflammation (CRP: Acute Phase) (ESR: Chronic Phase) No results found for: CRP, ESRSEDRATE, LATICACIDVEN     Note: Above Lab results reviewed.   Physical Exam  General appearance: Well nourished, well developed, and well hydrated. In no apparent acute distress Mental status: Alert, oriented x 3 (person, place, & time)       Respiratory: No evidence of acute respiratory distress Eyes: PERLA Vitals: BP 109/81 (BP Location: Right Arm, Patient Position: Sitting, Cuff Size: Normal)   Pulse 67   Temp 98.5 F (36.9 C) (Temporal)   Resp 16   Ht '5\' 4"'  (1.626 m)   Wt 172 lb (78 kg)   SpO2 98%   BMI 29.52 kg/m  BMI: Estimated body mass index is 29.52 kg/m as calculated from the following:   Height as of this encounter: '5\' 4"'  (1.626 m).   Weight as of this encounter: 172 lb (78 kg). Ideal: Ideal body weight: 54.7 kg (120 lb 9.5 oz) Adjusted ideal body weight: 64 kg (141 lb 2.5 oz)  Cervical Spine Exam  Skin & Axial Inspection: No masses, redness, edema, swelling, or associated skin lesions Alignment: Symmetrical Functional ROM: Pain restricted ROM     Positive Spurling's on the right Stability: No instability detected Muscle Tone/Strength: Functionally intact. No obvious neuro-muscular anomalies detected. Sensory  (Neurological): Dermatomal pain pattern right C6, C7 Palpation: No palpable anomalies                    Upper Extremity (UE) Exam    Side: Right upper extremity  Side: Left upper extremity   Skin & Extremity Inspection: Skin color, temperature, and hair growth are WNL. No peripheral edema or cyanosis. No masses, redness, swelling, asymmetry, or associated skin lesions. No contractures.  Skin & Extremity Inspection: Skin color, temperature, and hair growth are WNL. No peripheral edema or cyanosis. No masses, redness, swelling, asymmetry, or associated skin lesions. No contractures.   Functional ROM: Pain restricted ROM for all joints of upper extremity  Functional ROM: Unrestricted ROM           Muscle Tone/Strength: Movement possible against some resistance (4/5) elbow flexion, extension   Muscle Tone/Strength: Functionally intact. No obvious neuro-muscular anomalies detected.   Sensory (Neurological): Neurogenic pain pattern          Sensory (Neurological): Unimpaired           Palpation: No palpable anomalies              Palpation: No palpable anomalies               Provocative Test(s):  Phalen's test: deferred Tinel's test: deferred Apley's scratch test (touch opposite shoulder):  Action 1 (Across chest): Decreased ROM  Action 2 (Overhead): Decreased ROM Action 3 (LB reach): Decreased ROM   Provocative Test(s):  Phalen's test: deferred Tinel's test: deferred Apley's scratch test (touch opposite shoulder):  Action 1 (Across chest): deferred Action 2 (Overhead): deferred Action 3 (LB reach): deferred     Lumbar Exam  Skin & Axial Inspection: No masses, redness, or swelling Alignment: Symmetrical Functional ROM: Pain restricted ROM       Stability: No instability detected Muscle Tone/Strength: Functionally intact. No obvious neuro-muscular anomalies detected. Sensory (Neurological): Musculoskeletal pain pattern  Provocative Tests: Hyperextension/rotation test:  deferred today       Lumbar quadrant test (Kemp's test): deferred today       Lateral bending test: deferred today       Patrick's Maneuver: (+) for bilateral S-I arthralgia             FABER* test: (+) for bilateral S-I arthralgia             Gait & Posture Assessment  Ambulation: Unassisted Gait: Relatively normal for age and body habitus Posture: WNL   Lower Extremity Exam    Side: Right lower extremity  Side: Left lower extremity  Stability: No instability observed          Stability: No instability observed          Skin & Extremity Inspection: Skin color, temperature, and hair growth are WNL. No peripheral edema or cyanosis. No masses, redness, swelling, asymmetry, or associated skin lesions. No contractures.  Skin & Extremity Inspection: Skin color, temperature, and hair growth are WNL. No peripheral edema or cyanosis. No masses, redness, swelling, asymmetry, or associated skin lesions. No contractures.  Functional ROM: Unrestricted ROM                  Functional ROM: Unrestricted ROM                  Muscle Tone/Strength: Functionally intact. No obvious neuro-muscular anomalies detected.  Muscle Tone/Strength: Functionally intact. No obvious neuro-muscular anomalies detected.  Sensory (Neurological): Unimpaired        Sensory (Neurological): Unimpaired        DTR: Patellar: deferred today Achilles: deferred today Plantar: deferred today  DTR: Patellar: deferred today Achilles: deferred today Plantar: deferred today  Palpation: No palpable anomalies  Palpation: No palpable anomalies    Assessment   Status Diagnosis  Persistent Persistent Persistent 1. Cervical radicular pain (right C5/7)   2. Foraminal stenosis of cervical region   3. Sacroiliac joint pain   4. SI joint arthritis   5. Lumbar radiculopathy (Right L5 EMG/NCV)   6. Lumbar degenerative disc disease   7. Chronic pain syndrome       Plan of Care   Patient will get [place] her updated cervical and  lumbar MRI results from [place].  I will review and let her know what the next steps of her treatment plan will be.   Follow-up plan:   No follow-ups on file.     Right C7-T1 ESI, bilateral sacroiliac joint injection 09/19/2020    Recent Visits No visits were found meeting these conditions. Showing recent visits within past 90 days and meeting all other requirements Today's Visits Date Type Provider Dept  04/22/21 Office Visit Gillis Santa, MD [facility]  Showing today's visits and meeting all other requirements Future Appointments No visits were found meeting these conditions. Showing future appointments within next 90 days and meeting all other requirements  I discussed the assessment and treatment plan  with the patient. The patient was provided an opportunity to ask questions and all were answered. The patient agreed with the plan and demonstrated an understanding of the instructions.  Patient advised to call back or seek an in-person evaluation if the symptoms or condition worsens.  Duration of encounter: 20 minutes.  Note by: Gillis Santa, MD Date: 04/22/2021; Time: 3:56 PM

## 2021-04-22 NOTE — Patient Instructions (Signed)
You will bring MRI results to clinic prior to next appt.

## 2021-04-22 NOTE — Progress Notes (Signed)
Safety precautions to be maintained throughout the outpatient stay will include: orient to surroundings, keep bed in low position, maintain call bell within reach at all times, provide assistance with transfer out of bed and ambulation.  

## 2021-05-01 ENCOUNTER — Ambulatory Visit: Admitting: Dermatology

## 2021-05-06 ENCOUNTER — Encounter: Payer: Self-pay | Admitting: Student in an Organized Health Care Education/Training Program

## 2021-05-06 ENCOUNTER — Ambulatory Visit
Payer: No Typology Code available for payment source | Attending: Student in an Organized Health Care Education/Training Program | Admitting: Student in an Organized Health Care Education/Training Program

## 2021-05-06 ENCOUNTER — Other Ambulatory Visit: Payer: Self-pay

## 2021-05-06 DIAGNOSIS — G894 Chronic pain syndrome: Secondary | ICD-10-CM | POA: Diagnosis present

## 2021-05-06 DIAGNOSIS — M7918 Myalgia, other site: Secondary | ICD-10-CM | POA: Insufficient documentation

## 2021-05-06 DIAGNOSIS — M5412 Radiculopathy, cervical region: Secondary | ICD-10-CM | POA: Diagnosis present

## 2021-05-06 MED ORDER — DEXAMETHASONE SODIUM PHOSPHATE 10 MG/ML IJ SOLN
INTRAMUSCULAR | Status: AC
  Filled 2021-05-06: qty 1

## 2021-05-06 MED ORDER — ORPHENADRINE CITRATE 30 MG/ML IJ SOLN
30.0000 mg | Freq: Once | INTRAMUSCULAR | Status: AC
  Administered 2021-05-06: 30 mg via INTRAMUSCULAR

## 2021-05-06 MED ORDER — ROPIVACAINE HCL 2 MG/ML IJ SOLN
INTRAMUSCULAR | Status: AC
  Filled 2021-05-06: qty 20

## 2021-05-06 MED ORDER — LIDOCAINE HCL (PF) 2 % IJ SOLN
INTRAMUSCULAR | Status: AC
  Filled 2021-05-06: qty 10

## 2021-05-06 MED ORDER — METHYLPREDNISOLONE ACETATE 40 MG/ML IJ SUSP
INTRAMUSCULAR | Status: AC
  Filled 2021-05-06: qty 1

## 2021-05-06 MED ORDER — KETOROLAC TROMETHAMINE 30 MG/ML IJ SOLN
30.0000 mg | Freq: Once | INTRAMUSCULAR | Status: AC
  Administered 2021-05-06: 30 mg via INTRAMUSCULAR

## 2021-05-06 MED ORDER — ORPHENADRINE CITRATE 30 MG/ML IJ SOLN
INTRAMUSCULAR | Status: AC
  Filled 2021-05-06: qty 2

## 2021-05-06 MED ORDER — ROPIVACAINE HCL 2 MG/ML IJ SOLN
9.0000 mL | Freq: Once | INTRAMUSCULAR | Status: AC
  Administered 2021-05-06: 9 mL via PERINEURAL

## 2021-05-06 MED ORDER — DEXAMETHASONE SODIUM PHOSPHATE 10 MG/ML IJ SOLN
10.0000 mg | Freq: Once | INTRAMUSCULAR | Status: AC
  Administered 2021-05-06: 10 mg

## 2021-05-06 MED ORDER — KETOROLAC TROMETHAMINE 30 MG/ML IJ SOLN
INTRAMUSCULAR | Status: AC
  Filled 2021-05-06: qty 1

## 2021-05-06 NOTE — Progress Notes (Signed)
Safety precautions to be maintained throughout the outpatient stay will include: orient to surroundings, keep bed in low position, maintain call bell within reach at all times, provide assistance with transfer out of bed and ambulation.  

## 2021-05-06 NOTE — Patient Instructions (Signed)
____________________________________________________________________________________________  Post-Procedure Discharge Instructions  Instructions:  Apply ice:   Purpose: This will minimize any swelling and discomfort after procedure.   When: Day of procedure, as soon as you get home.  How: Fill a plastic sandwich bag with crushed ice. Cover it with a small towel and apply to injection site.  How long: (15 min on, 15 min off) Apply for 15 minutes then remove x 15 minutes.  Repeat sequence on day of procedure, until you go to bed.  Apply heat:   Purpose: To treat any soreness and discomfort from the procedure.  When: Starting the next day after the procedure.  How: Apply heat to procedure site starting the day following the procedure.  How long: May continue to repeat daily, until discomfort goes away.  Food intake: Start with clear liquids (like water) and advance to regular food, as tolerated.   Physical activities: Keep activities to a minimum for the first 8 hours after the procedure. After that, then as tolerated.  Driving: If you have received any sedation, be responsible and do not drive. You are not allowed to drive for 24 hours after having sedation.  Blood thinner: (Applies only to those taking blood thinners) You may restart your blood thinner 6 hours after your procedure.  Insulin: (Applies only to Diabetic patients taking insulin) As soon as you can eat, you may resume your normal dosing schedule.  Infection prevention: Keep procedure site clean and dry. Shower daily and clean area with soap and water.  Post-procedure Pain Diary: Extremely important that this be done correctly and accurately. Recorded information will be used to determine the next step in treatment. For the purpose of accuracy, follow these rules:  Evaluate only the area treated. Do not report or include pain from an untreated area. For the purpose of this evaluation, ignore all other areas of pain,  except for the treated area.  After your procedure, avoid taking a long nap and attempting to complete the pain diary after you wake up. Instead, set your alarm clock to go off every hour, on the hour, for the initial 8 hours after the procedure. Document the duration of the numbing medicine, and the relief you are getting from it.  Do not go to sleep and attempt to complete it later. It will not be accurate. If you received sedation, it is likely that you were given a medication that may cause amnesia. Because of this, completing the diary at a later time may cause the information to be inaccurate. This information is needed to plan your care.  Follow-up appointment: Keep your post-procedure follow-up evaluation appointment after the procedure (usually 2 weeks for most procedures, 6 weeks for radiofrequencies). DO NOT FORGET to bring you pain diary with you.   Expect: (What should I expect to see with my procedure?)  From numbing medicine (AKA: Local Anesthetics): Numbness or decrease in pain. You may also experience some weakness, which if present, could last for the duration of the local anesthetic.  Onset: Full effect within 15 minutes of injected.  Duration: It will depend on the type of local anesthetic used. On the average, 1 to 8 hours.   From steroids (Applies only if steroids were used): Decrease in swelling or inflammation. Once inflammation is improved, relief of the pain will follow.  Onset of benefits: Depends on the amount of swelling present. The more swelling, the longer it will take for the benefits to be seen. In some cases, up to 10 days.    Duration: Steroids will stay in the system x 2 weeks. Duration of benefits will depend on multiple posibilities including persistent irritating factors.  Side-effects: If present, they may typically last 2 weeks (the duration of the steroids).  Frequent: Cramps (if they occur, drink Gatorade and take over-the-counter Magnesium 450-500 mg  once to twice a day); water retention with temporary weight gain; increases in blood sugar; decreased immune system response; increased appetite.  Occasional: Facial flushing (red, warm cheeks); mood swings; menstrual changes.  Uncommon: Long-term decrease or suppression of natural hormones; bone thinning. (These are more common with higher doses or more frequent use. This is why we prefer that our patients avoid having any injection therapies in other practices.)   Very Rare: Severe mood changes; psychosis; aseptic necrosis.  From procedure: Some discomfort is to be expected once the numbing medicine wears off. This should be minimal if ice and heat are applied as instructed.  Call if: (When should I call?)  You experience numbness and weakness that gets worse with time, as opposed to wearing off.  New onset bowel or bladder incontinence. (Applies only to procedures done in the spine)  Emergency Numbers:  Durning business hours (Monday - Thursday, 8:00 AM - 4:00 PM) (Friday, 9:00 AM - 12:00 Noon): [phone]  After hours: [phone]  NOTE: If you are having a problem and are unable connect with, or to talk to a provider, then go to your nearest urgent care or emergency department. If the problem is serious and urgent, please call 911. ____________________________________________________________________________________________   ____________________________________________________________________________________________  Preparing for your procedure (without sedation)  Procedure appointments are limited to planned procedures: . No Prescription Refills. . No disability issues will be discussed. . No medication changes will be discussed.  Instructions: . Oral Intake: Do not eat or drink anything for at least 6 hours prior to your procedure. (Exception: Blood Pressure Medication. See below.) . Transportation: Unless otherwise stated by your physician, you may drive yourself  after the procedure. . Blood Pressure Medicine: Do not forget to take your blood pressure medicine with a sip of water the morning of the procedure. If your Diastolic (lower reading)is above 100 mmHg, elective cases will be cancelled/rescheduled. . Blood thinners: These will need to be stopped for procedures. Notify our staff if you are taking any blood thinners. Depending on which one you take, there will be specific instructions on how and when to stop it. . Diabetics on insulin: Notify the staff so that you can be scheduled 1st case in the morning. If your diabetes requires high dose insulin, take only  of your normal insulin dose the morning of the procedure and notify the staff that you have done so. . Preventing infections: Shower with an antibacterial soap the morning of your procedure.  . Build-up your immune system: Take 1000 mg of Vitamin C with every meal (3 times a day) the day prior to your procedure. . Antibiotics: Inform the staff if you have a condition or reason that requires you to take antibiotics before dental procedures. . Pregnancy: If you are pregnant, call and cancel the procedure. . Sickness: If you have a cold, fever, or any active infections, call and cancel the procedure. . Arrival: You must be in the facility at least 30 minutes prior to your scheduled procedure. . Children: Do not bring any children with you. . Dress appropriately: Bring dark clothing that you would not mind if they get stained. . Valuables: Do not bring any jewelry   or valuables.  Reasons to call and reschedule or cancel your procedure: (Following these recommendations will minimize the risk of a serious complication.) . Surgeries: Avoid having procedures within 2 weeks of any surgery. (Avoid for 2 weeks before or after any surgery). . Flu Shots: Avoid having procedures within 2 weeks of a flu shots or . (Avoid for 2 weeks before or after immunizations). . Barium: Avoid having a procedure within 7-10  days after having had a radiological study involving the use of radiological contrast. (Myelograms, Barium swallow or enema study). . Heart attacks: Avoid any elective procedures or surgeries for the initial 6 months after a "Myocardial Infarction" (Heart Attack). . Blood thinners: It is imperative that you stop these medications before procedures. Let us know if you if you take any blood thinner.  . Infection: Avoid procedures during or within two weeks of an infection (including chest colds or gastrointestinal problems). Symptoms associated with infections include: Localized redness, fever, chills, night sweats or profuse sweating, burning sensation when voiding, cough, congestion, stuffiness, runny nose, sore throat, diarrhea, nausea, vomiting, cold or Flu symptoms, recent or current infections. It is specially important if the infection is over the area that we intend to treat. . Heart and lung problems: Symptoms that may suggest an active cardiopulmonary problem include: cough, chest pain, breathing difficulties or shortness of breath, dizziness, ankle swelling, uncontrolled high or unusually low blood pressure, and/or palpitations. If you are experiencing any of these symptoms, cancel your procedure and contact your primary care physician for an evaluation.  Remember:  Regular Business hours are:  Monday to Thursday 8:00 AM to 4:00 PM  Provider's Schedule: Francisco Naveira, MD:  Procedure days: Tuesday and Thursday 7:30 AM to 4:00 PM  Bilal Lateef, MD:  Procedure days: Monday and Wednesday 7:30 AM to 4:00 PM ____________________________________________________________________________________________    

## 2021-05-06 NOTE — Progress Notes (Signed)
PROVIDER NOTE: Information contained herein reflects review and annotations entered in association with encounter. Interpretation of such information and data should be left to medically-trained personnel. Information provided to patient can be located elsewhere in the medical record under "Patient Instructions". Document created using STT-dictation technology, any transcriptional errors that may result from process are unintentional.    Patient: Jessica Knight  Service Category: Procedure  Provider: Edward Jolly, MD  DOB: 1981-01-08  DOS: 05/06/2021  Location: [facility]  MRN: [id number]  Setting: Ambulatory - outpatient  Referring Provider: Jordan Hawks, PA-C  Type: Established Patient  Specialty: Interventional Pain Management  PCP: Jordan Hawks, PA-C   Primary Reason for Visit: Interventional Pain Management Treatment. CC: Neck Pain (Left ) and Back Pain (Left lumbar )  Procedure:          Anesthesia, Analgesia, Anxiolysis:  Type: Thoraco Lumbar Trigger Point Injection (3+ muscle groups)          CPT: 69450 Primary Purpose: Therapeutic Thoracic and Lumbar  Approach: Percutaneous, ipsilateral approach. Laterality: Midline        Type: Local Anesthesia Indication(s): Analgesia         Local Anesthetic: Lidocaine 1-2% Route: Infiltration ([place]/IM) IV Access: Declined Sedation: Declined   Position: Prone   Indications: 1. Cervical radicular pain (right C5/7)   2. Myofascial pain syndrome of lumbar spine   3. Chronic pain syndrome     Jessica Knight presents today with increased neck, thoracic and lumbar spine pain.  She states that she had a difficult weekend in terms of managing her pain.  She has brought in her MRI results of her cervical and lumbar spine.  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  In  regards to her cervical MRI, patient has mild posterior disc osteophyte complex at C5-C6 which partially effaces the ventral CSF without causing canal stenosis.  She also has moderate to advanced bilateral hypertrophy with moderate to advanced bilateral neuroforaminal stenosis at C5-C6.  We discussed a cervical epidural steroid injection for cervical radicular pain.  Patient had this done by me in October 2021 which was somewhat helpful.  I will also administer intramuscular Norflex and Toradol for diffuse whole body pain.  We will plan for lumbar trigger point injections as well today.  Physical: Mild distress Respiratory: No increased work of breathing, no wheezing Musculoskeletal: Diffuse lumbar pain, radiating cervical pain into her right arm. Multiple trigger points along lumbar spin 5 out of 5 strength bilateral lower extremity: Plantar flexion, dorsiflexion, knee flexion, knee extension.  Assessment and plan: 1.  Intramuscular Norflex Toradol today 2.  Lumbar trigger point injections today 3.  Reviewed lumbar and cervical MRI results with patient that she brought from [place].  Recommend diagnostic cervical epidural steroid injection for cervical radicular pain right greater than left due to C5-C6 disc herniation. 4.  Encourage patient to continue with TENS unit, ice, stretching exercises to her low back    Pain Score: Pre-procedure: 10-Worst pain ever/10 Post-procedure: 10-Worst pain ever/10   Pre-op H&P Assessment:  Jessica Knight is a 40 y.o. (year old), female patient, seen today for interventional treatment. She  has a past surgical history that includes Breast surgery; LASIK; and Hernia repair. Jessica Knight has a current medication list which includes the following prescription(s): albuterol sulfate, amoxicillin, budesonide-formoterol, carboxymethylcellulose, cetirizine, fluticasone, ibuprofen, levothyroxine, lidocaine, loratadine, meloxicam,  montelukast, and multivitamin. Her primarily  concern today is the Neck Pain (Left ) and Back Pain (Left lumbar )   BMI: Estimated body mass index is 29.52 kg/m as calculated from the following:   Height as of 04/22/21: 5\' 4"  (1.626 m).   Weight as of 04/22/21: 172 lb (78 kg).  Risk Assessment: Allergies: Reviewed. She has No Known Allergies.  Allergy Precautions: None required Coagulopathies: Reviewed. None identified.  Blood-thinner therapy: None at this time Active Infection(s): Reviewed. None identified. Jessica Knight is afebrile  Site Confirmation: Jessica Knight was asked to confirm the procedure and laterality before marking the site Procedure checklist: Completed Consent: Before the procedure and under the influence of no sedative(s), amnesic(s), or anxiolytics, the patient was informed of the treatment options, risks and possible complications. To fulfill our ethical and legal obligations, as recommended by the American Medical Association's Code of Ethics, I have informed the patient of my clinical impression; the nature and purpose of the treatment or procedure; the risks, benefits, and possible complications of the intervention; the alternatives, including doing nothing; the risk(s) and benefit(s) of the alternative treatment(s) or procedure(s); and the risk(s) and benefit(s) of doing nothing. The patient was provided information about the general risks and possible complications associated with the procedure. These may include, but are not limited to: failure to achieve desired goals, infection, bleeding, organ or nerve damage, allergic reactions, paralysis, and death. In addition, the patient was informed of those risks and complications associated to the procedure, such as failure to decrease pain; infection; bleeding; organ or nerve damage with subsequent damage to sensory, motor, and/or autonomic systems, resulting in permanent pain, numbness, and/or weakness of one or several areas of the body; allergic reactions; (i.e.:  anaphylactic reaction); and/or death. Furthermore, the patient was informed of those risks and complications associated with the medications. These include, but are not limited to: allergic reactions (i.e.: anaphylactic or anaphylactoid reaction(s)); adrenal axis suppression; blood sugar elevation that in diabetics may result in ketoacidosis or comma; water retention that in patients with history of congestive heart failure may result in shortness of breath, pulmonary edema, and decompensation with resultant heart failure; weight gain; swelling or edema; medication-induced neural toxicity; particulate matter embolism and blood vessel occlusion with resultant organ, and/or nervous system infarction; and/or aseptic necrosis of one or more joints. Finally, the patient was informed that Medicine is not an exact science; therefore, there is also the possibility of unforeseen or unpredictable risks and/or possible complications that may result in a catastrophic outcome. The patient indicated having understood very clearly. We have given the patient no guarantees and we have made no promises. Enough time was given to the patient to ask questions, all of which were answered to the patient's satisfaction. Ms. Bressi has indicated that she wanted to continue with the procedure. Attestation: I, the ordering provider, attest that I have discussed with the patient the benefits, risks, side-effects, alternatives, likelihood of achieving goals, and potential problems during recovery for the procedure that I have provided informed consent. Date  Time: 05/06/2021 10:20 AM  Pre-Procedure Preparation:  Monitoring: As per clinic protocol. Respiration, ETCO2, SpO2, BP, heart rate and rhythm monitor placed and checked for adequate function Safety Precautions: Patient was assessed for positional comfort and pressure points before starting the procedure. Time-out: I initiated and conducted the "Time-out" before starting the  procedure, as per protocol. The patient was asked to participate by confirming the accuracy of the "Time Out" information. Verification of the correct person,  site, and procedure were performed and confirmed by me, the nursing staff, and the patient. "Time-out" conducted as per Joint Commission's Universal Protocol (UP.01.01.01). Time: 1101  Description of Procedure:          Area Prepped: Entire             Region DuraPrep (Iodine Povacrylex [0.7% available iodine] and Isopropyl Alcohol, 74% w/w) Safety Precautions: Aspiration looking for blood return was conducted prior to all injections. At no point did we inject any substances, as a needle was being advanced. No attempts were made at seeking any paresthesias. Safe injection practices and needle disposal techniques used. Medications properly checked for expiration dates. SDV (single dose vial) medications used. Description of the Procedure: Protocol guidelines were followed. The patient was placed in position over the fluoroscopy table. The target area was identified and the area prepped in the usual manner. Skin & deeper tissues infiltrated with local anesthetic. Appropriate amount of time allowed to pass for local anesthetics to take effect. The procedure needles were then advanced to the target area. Proper needle placement secured. Negative aspiration confirmed. Solution injected in intermittent fashion, asking for systemic symptoms every 0.5cc of injectate. The needles were then removed and the area cleansed, making sure to leave some of the prepping solution back to take advantage of its long term bactericidal properties.  There were no vitals filed for this visit.  Start Time: 1101 hrs. End Time: 1104 hrs. Materials:  Needle(s) Type: Regular needle Gauge: 25G Length: 1.5-in Medication(s): Please see orders for medications and dosing details.  10 cc solution made of 9 cc of 0.2% Vivacaine, 1 cc of Decadron 10 mg/cc.  Approximately 10  trigger points injected with dry needling performed.   Plan of Care  Orders:  Orders Placed This Encounter  Procedures  . Cervical Epidural Injection    Level(s): C7-T1 Laterality: TBD Purpose: Diagnostic/Therapeutic Indication(s): Radiculitis and cervicalgia associater with cervical degenerative disc disease.    Standing Status:   Future    Standing Expiration Date:   06/05/2021    Scheduling Instructions:     Procedure: Cervical Epidural Steroid Injection/Block     Sedation: Patient's choice.     Timeframe: As soon as schedule allows    Order Specific Question:   Where will this procedure be performed?    Answer:   [facility]    Comments:   Mily Malecki    Medications ordered for procedure: Meds ordered this encounter  Medications  . orphenadrine (NORFLEX) injection 30 mg  . ketorolac (TORADOL) 30 MG/ML injection 30 mg  . ropivacaine (PF) 2 mg/mL (0.2%) (NAROPIN) injection 9 mL  . dexamethasone (DECADRON) injection 10 mg   Medications administered: We administered orphenadrine, ketorolac, ropivacaine (PF) 2 mg/mL (0.2%), and dexamethasone.  See the medical record for exact dosing, route, and time of administration.  Follow-up plan:   Return in about 2 weeks (around 05/20/2021) for Right C-ESI , with sedation.      Right C7-T1 ESI, bilateral sacroiliac joint injection 09/19/2020     Recent Visits Date Type Provider Dept  04/22/21 Office Visit Edward JollyLateef, Khristian Phillippi, MD [facility]  Showing recent visits within past 90 days and meeting all other requirements Today's Visits Date Type Provider Dept  05/06/21 Procedure visit Edward JollyLateef, Farhiya Rosten, MD [facility]  Showing today's visits and meeting all other requirements Future Appointments Date Type Provider Dept  05/20/21 Appointment Edward JollyLateef, Robey Massmann, MD [facility]  Showing future appointments within next 90 days and  meeting all other requirements  Disposition: Discharge home  Discharge (Date  Time):  05/06/2021;   hrs.   Primary Care Physician: Barbarann Ehlers Location: [facility] Note by: Edward Jolly, MD Date: 05/06/2021; Time: 11:33 AM  Disclaimer:  Medicine is not an exact science. The only guarantee in medicine is that nothing is guaranteed. It is important to note that the decision to proceed with this intervention was based on the information collected from the patient. The Data and conclusions were drawn from the patient's questionnaire, the interview, and the physical examination. Because the information was provided in large part by the patient, it cannot be guaranteed that it has not been purposely or unconsciously manipulated. Every effort has been made to obtain as much relevant data as possible for this evaluation. It is important to note that the conclusions that lead to this procedure are derived in large part from the available data. Always take into account that the treatment will also be dependent on availability of resources and existing treatment guidelines, considered by other Pain Management Practitioners as being common knowledge and practice, at the time of the intervention. For Medico-Legal purposes, it is also important to point out that variation in procedural techniques and pharmacological choices are the acceptable norm. The indications, contraindications, technique, and results of the above procedure should only be interpreted and judged by a Board-Certified Interventional Pain Specialist with extensive familiarity and expertise in the same exact procedure and technique.

## 2021-05-07 ENCOUNTER — Telehealth: Payer: Self-pay | Admitting: *Deleted

## 2021-05-07 NOTE — Telephone Encounter (Signed)
No problems post procedure. 

## 2021-05-20 ENCOUNTER — Ambulatory Visit: Admitting: Student in an Organized Health Care Education/Training Program

## 2021-05-20 ENCOUNTER — Encounter: Payer: Self-pay | Admitting: Student in an Organized Health Care Education/Training Program

## 2021-05-27 ENCOUNTER — Ambulatory Visit (HOSPITAL_BASED_OUTPATIENT_CLINIC_OR_DEPARTMENT_OTHER)
Payer: No Typology Code available for payment source | Admitting: Student in an Organized Health Care Education/Training Program

## 2021-05-27 ENCOUNTER — Other Ambulatory Visit: Payer: Self-pay

## 2021-05-27 ENCOUNTER — Encounter: Payer: Self-pay | Admitting: Student in an Organized Health Care Education/Training Program

## 2021-05-27 ENCOUNTER — Ambulatory Visit
Admission: RE | Admit: 2021-05-27 | Discharge: 2021-05-27 | Disposition: A | Discharge status: Home / Self Care | Payer: No Typology Code available for payment source | Source: Ambulatory Visit | Attending: Student in an Organized Health Care Education/Training Program | Admitting: Student in an Organized Health Care Education/Training Program

## 2021-05-27 VITALS — BP 121/79 | HR 68 | Temp 96.6°F | Resp 18 | Ht 64.0 in | Wt 167.0 lb

## 2021-05-27 DIAGNOSIS — M5412 Radiculopathy, cervical region: Secondary | ICD-10-CM | POA: Diagnosis present

## 2021-05-27 DIAGNOSIS — G894 Chronic pain syndrome: Secondary | ICD-10-CM | POA: Diagnosis not present

## 2021-05-27 MED ORDER — ROPIVACAINE HCL 2 MG/ML IJ SOLN
INTRAMUSCULAR | Status: AC
  Filled 2021-05-27: qty 20

## 2021-05-27 MED ORDER — LIDOCAINE HCL 2 % IJ SOLN
INTRAMUSCULAR | Status: AC
  Filled 2021-05-27: qty 10

## 2021-05-27 MED ORDER — DIAZEPAM 5 MG PO TABS
ORAL_TABLET | ORAL | Status: AC
  Filled 2021-05-27: qty 1

## 2021-05-27 MED ORDER — ROPIVACAINE HCL 2 MG/ML IJ SOLN
1.0000 mL | Freq: Once | INTRAMUSCULAR | Status: AC
  Administered 2021-05-27: 1 mL via EPIDURAL

## 2021-05-27 MED ORDER — DEXAMETHASONE SODIUM PHOSPHATE 10 MG/ML IJ SOLN
10.0000 mg | Freq: Once | INTRAMUSCULAR | Status: AC
  Administered 2021-05-27: 10 mg

## 2021-05-27 MED ORDER — SODIUM CHLORIDE 0.9% FLUSH
1.0000 mL | Freq: Once | INTRAVENOUS | Status: AC
  Administered 2021-05-27: 1 mL

## 2021-05-27 MED ORDER — SODIUM CHLORIDE (PF) 0.9 % IJ SOLN
INTRAMUSCULAR | Status: AC
  Filled 2021-05-27: qty 10

## 2021-05-27 MED ORDER — IOHEXOL 180 MG/ML  SOLN
10.0000 mL | Freq: Once | INTRAMUSCULAR | Status: AC
  Administered 2021-05-27: 20 mL via EPIDURAL

## 2021-05-27 MED ORDER — DIAZEPAM 5 MG PO TABS
5.0000 mg | ORAL_TABLET | Freq: Once | ORAL | Status: AC
  Administered 2021-05-27: 5 mg via ORAL

## 2021-05-27 MED ORDER — LIDOCAINE HCL 2 % IJ SOLN
20.0000 mL | Freq: Once | INTRAMUSCULAR | Status: AC
  Administered 2021-05-27: 200 mg

## 2021-05-27 MED ORDER — DEXAMETHASONE SODIUM PHOSPHATE 10 MG/ML IJ SOLN
INTRAMUSCULAR | Status: AC
  Filled 2021-05-27: qty 1

## 2021-05-27 NOTE — Progress Notes (Signed)
PROVIDER NOTE: Information contained herein reflects review and annotations entered in association with encounter. Interpretation of such information and data should be left to medically-trained personnel. Information provided to patient can be located elsewhere in the medical record under "Patient Instructions". Document created using STT-dictation technology, any transcriptional errors that may result from process are unintentional.    Patient: Jessica Knight  Service Category: Procedure  Provider: Edward Jolly, MD  DOB: 1981/04/22  DOS: 05/27/2021  Location: [facility]  MRN: [id number]  Setting: Ambulatory - outpatient  Referring Provider: Jordan Hawks, PA-C  Type: Established Patient  Specialty: Interventional Pain Management  PCP: Jordan Hawks, PA-C   Primary Reason for Visit: Interventional Pain Management Treatment. CC: Back Pain (lower)  Procedure:          Anesthesia, Analgesia, Anxiolysis:  Type: Therapeutic, Inter-Laminar, Cervical Epidural Steroid Injection  #2  Region: Posterior Cervico-thoracic Region Level: C7-T1 Laterality: Right-Sided Paramedial  Type: Local Anesthesia with PO Valium  Local Anesthetic: Lidocaine 1-2%  Position: Prone with head of the table was raised to facilitate breathing.   Indications: 1. Cervical radicular pain (right C5/7)   2. Chronic pain syndrome    Pain Score: Pre-procedure: 5 /10 Post-procedure: 3 /10   Pre-op H&P Assessment:  Jessica Knight is a 40 y.o. (year old), female patient, seen today for interventional treatment. She  has a past surgical history that includes Breast surgery; LASIK; and Hernia repair. Jessica Knight has a current medication list which includes the following prescription(s): albuterol sulfate, budesonide-formoterol, carboxymethylcellulose, cetirizine, fluticasone, ibuprofen, levothyroxine, lidocaine, loratadine, meloxicam, montelukast, multivitamin, and amoxicillin. Her primarily concern today is the  Back Pain (lower)  Initial Vital Signs:  Pulse/HCG Rate: 68ECG Heart Rate: (!) 58 Temp: (!) 96.6 F (35.9 C) Resp: 16 BP: 116/78 SpO2: 100 %  BMI: Estimated body mass index is 28.67 kg/m as calculated from the following:   Height as of this encounter: 5\' 4"  (1.626 m).   Weight as of this encounter: 167 lb (75.8 kg).  Risk Assessment: Allergies: Reviewed. She has No Known Allergies.  Allergy Precautions: None required Coagulopathies: Reviewed. None identified.  Blood-thinner therapy: None at this time Active Infection(s): Reviewed. None identified. Jessica Knight is afebrile  Site Confirmation: Jessica Knight was asked to confirm the procedure and laterality before marking the site Procedure checklist: Completed Consent: Before the procedure and under the influence of no sedative(s), amnesic(s), or anxiolytics, the patient was informed of the treatment options, risks and possible complications. To fulfill our ethical and legal obligations, as recommended by the American Medical Association's Code of Ethics, I have informed the patient of my clinical impression; the nature and purpose of the treatment or procedure; the risks, benefits, and possible complications of the intervention; the alternatives, including doing nothing; the risk(s) and benefit(s) of the alternative treatment(s) or procedure(s); and the risk(s) and benefit(s) of doing nothing. The patient was provided information about the general risks and possible complications associated with the procedure. These may include, but are not limited to: failure to achieve desired goals, infection, bleeding, organ or nerve damage, allergic reactions, paralysis, and death. In addition, the patient was informed of those risks and complications associated to Spine-related procedures, such as failure to decrease pain; infection (i.e.: Meningitis, epidural or intraspinal abscess); bleeding (i.e.: epidural hematoma, subarachnoid hemorrhage, or any  other type of intraspinal or peri-dural bleeding); organ or nerve damage (i.e.: Any type of peripheral nerve, nerve root, or spinal cord injury) with subsequent damage to sensory, motor,  and/or autonomic systems, resulting in permanent pain, numbness, and/or weakness of one or several areas of the body; allergic reactions; (i.e.: anaphylactic reaction); and/or death. Furthermore, the patient was informed of those risks and complications associated with the medications. These include, but are not limited to: allergic reactions (i.e.: anaphylactic or anaphylactoid reaction(s)); adrenal axis suppression; blood sugar elevation that in diabetics may result in ketoacidosis or comma; water retention that in patients with history of congestive heart failure may result in shortness of breath, pulmonary edema, and decompensation with resultant heart failure; weight gain; swelling or edema; medication-induced neural toxicity; particulate matter embolism and blood vessel occlusion with resultant organ, and/or nervous system infarction; and/or aseptic necrosis of one or more joints. Finally, the patient was informed that Medicine is not an exact science; therefore, there is also the possibility of unforeseen or unpredictable risks and/or possible complications that may result in a catastrophic outcome. The patient indicated having understood very clearly. We have given the patient no guarantees and we have made no promises. Enough time was given to the patient to ask questions, all of which were answered to the patient's satisfaction. Jessica Knight has indicated that she wanted to continue with the procedure. Attestation: I, the ordering provider, attest that I have discussed with the patient the benefits, risks, side-effects, alternatives, likelihood of achieving goals, and potential problems during recovery for the procedure that I have provided informed consent. Date  Time: 05/27/2021  8:34 AM  Pre-Procedure Preparation:   Monitoring: As per clinic protocol. Respiration, ETCO2, SpO2, BP, heart rate and rhythm monitor placed and checked for adequate function Safety Precautions: Patient was assessed for positional comfort and pressure points before starting the procedure. Time-out: I initiated and conducted the "Time-out" before starting the procedure, as per protocol. The patient was asked to participate by confirming the accuracy of the "Time Out" information. Verification of the correct person, site, and procedure were performed and confirmed by me, the nursing staff, and the patient. "Time-out" conducted as per Joint Commission's Universal Protocol (UP.01.01.01). Time: 0909  Description of Procedure:          Target Area: For Epidural Steroid injections the target is the interlaminar space, initially targeting the lower border of the superior vertebral body lamina. Approach: Paramedial approach. Area Prepped: Entire PosteriorCervical Region DuraPrep (Iodine Povacrylex [0.7% available iodine] and Isopropyl Alcohol, 74% w/w) Safety Precautions: Aspiration looking for blood return was conducted prior to all injections. At no point did we inject any substances, as a needle was being advanced. No attempts were made at seeking any paresthesias. Safe injection practices and needle disposal techniques used. Medications properly checked for expiration dates. SDV (single dose vial) medications used. Description of the Procedure: Protocol guidelines were followed. The procedure needle was introduced through the skin, ipsilateral to the reported pain, and advanced to the target area. Bone was contacted and the needle walked caudad, until the lamina was cleared. The epidural space was identified using "loss-of-resistance technique" with 2-3 ml of PF-NaCl (0.9% NSS), in a 5cc LOR glass syringe. Vitals:   05/27/21 0835 05/27/21 0905 05/27/21 0910 05/27/21 0915  BP: 116/78 (!) 128/92 123/81 121/79  Pulse: 68     Resp: 16 18 16 18    Temp: (!) 96.6 F (35.9 C)     TempSrc: Temporal     SpO2: 100% 100% 100% 100%  Weight: 167 lb (75.8 kg)     Height: 5\' 4"  (1.626 m)       Start Time: 0909 hrs. End  Time: 0915 hrs. Materials:  Needle(s) Type: Epidural needle Gauge: 22G Length: 3.5-in Medication(s): Please see orders for medications and dosing details. 3.5 cc solution made of 1 cc of preservative-free saline, 1.5 cc of 0.2% ropivacaine, 1 cc of Decadron 10 mg/cc.  Imaging Guidance (Spinal):          Type of Imaging Technique: Fluoroscopy Guidance (Spinal) Indication(s): Assistance in needle guidance and placement for procedures requiring needle placement in or near specific anatomical locations not easily accessible without such assistance. Exposure Time: Please see nurses notes. Contrast: Before injecting any contrast, we confirmed that the patient did not have an allergy to iodine, shellfish, or radiological contrast. Once satisfactory needle placement was completed at the desired level, radiological contrast was injected. Contrast injected under live fluoroscopy. No contrast complications. See chart for type and volume of contrast used. Fluoroscopic Guidance: I was personally present during the use of fluoroscopy. "Tunnel Vision Technique" used to obtain the best possible view of the target area. Parallax error corrected before commencing the procedure. "Direction-depth-direction" technique used to introduce the needle under continuous pulsed fluoroscopy. Once target was reached, antero-posterior, oblique, and lateral fluoroscopic projection used confirm needle placement in all planes. Images permanently stored in EMR. Interpretation: I personally interpreted the imaging intraoperatively. Adequate needle placement confirmed in multiple planes. Appropriate spread of contrast into desired area was observed. No evidence of afferent or efferent intravascular uptake. No intrathecal or subarachnoid spread observed. Permanent  images saved into the patient's record.   Post-operative Assessment:  Post-procedure Vital Signs:  Pulse/HCG Rate: 6860 Temp: (!) 96.6 F (35.9 C) Resp: 18 BP: 121/79 SpO2: 100 %  EBL: None  Complications: No immediate post-treatment complications observed by team, or reported by patient.  Note: The patient tolerated the entire procedure well. A repeat set of vitals were taken after the procedure and the patient was kept under observation following institutional policy, for this type of procedure. Post-procedural neurological assessment was performed, showing return to baseline, prior to discharge. The patient was provided with post-procedure discharge instructions, including a section on how to identify potential problems. Should any problems arise concerning this procedure, the patient was given instructions to immediately contact us, at any time, without hesitation. In any case, we plan to contact the patient by telephone for a follow-up status report regarding this interventional procedure.  Comments:  No additional relevant information.  Plan of Care  Orders:  Orders Placed This Encounter  Procedures   DG PAIN CLINIC C-ARM 1-60 MIN NO REPORT    Intraoperative interpretation by procedural physician at [facility].    Standing Status:   Standing    Number of Occurrences:   1    Order Specific Question:   Reason for exam:    Answer:   Assistance in needle guidance and placement for procedures requiring needle placement in or near specific anatomical locations not easily accessible without such assistance.   5 out of 5 strength bilateral upper extremity: Shoulder abduction, elbow flexion, elbow extension, thumb extension.   Medications ordered for procedure: Meds ordered this encounter  Medications   iohexol (OMNIPAQUE) 180 MG/ML injection 10 mL    Must be Myelogram-compatible. If not available, you may substitute with a water-soluble, non-ionic, hypoallergenic,  myelogram-compatible radiological contrast medium.   lidocaine (XYLOCAINE) 2 % (with pres) injection 400 mg   ropivacaine (PF) 2 mg/mL (0.2%) (NAROPIN) injection 1 mL   sodium chloride flush (NS) 0.9 % injection 1 mL   dexamethasone (DECADRON) injection 10 mg  diazepam (VALIUM) tablet 5 mg   Medications administered: We administered iohexol, lidocaine, ropivacaine (PF) 2 mg/mL (0.2%), sodium chloride flush, dexamethasone, and diazepam.  See the medical record for exact dosing, route, and time of administration.  Follow-up plan:   Return in about 5 weeks (around 07/01/2021) for Post Procedure Evaluation, virtual.      Right C7-T1 ESI, bilateral sacroiliac joint injection 09/19/2020, Right C7-T1 ESI #2 05/27/21       Recent Visits Date Type Provider Dept  05/06/21 Procedure visit Edward Jolly, MD [facility]  04/22/21 Office Visit Edward Jolly, MD [facility]  Showing recent visits within past 90 days and meeting all other requirements Today's Visits Date Type Provider Dept  05/27/21 Procedure visit Edward Jolly, MD [facility]  Showing today's visits and meeting all other requirements Future Appointments Date Type Provider Dept  07/01/21 Appointment Edward Jolly, MD [facility]  Showing future appointments within next 90 days and meeting all other requirements Disposition: Discharge home  Discharge (Date  Time): 05/27/2021; 0930 hrs.   Primary Care Physician: Barbarann Ehlers Location: [facility] Note by: Edward Jolly, MD Date: 05/27/2021; Time: 9:43 AM  Disclaimer:  Medicine is not an exact science. The only guarantee in medicine is that nothing is guaranteed. It is important to note that the decision to proceed with this intervention was based on the information collected from the patient. The Data and conclusions were drawn from the patient's questionnaire, the interview, and the physical examination.  Because the information was provided in large part by the patient, it cannot be guaranteed that it has not been purposely or unconsciously manipulated. Every effort has been made to obtain as much relevant data as possible for this evaluation. It is important to note that the conclusions that lead to this procedure are derived in large part from the available data. Always take into account that the treatment will also be dependent on availability of resources and existing treatment guidelines, considered by other Pain Management Practitioners as being common knowledge and practice, at the time of the intervention. For Medico-Legal purposes, it is also important to point out that variation in procedural techniques and pharmacological choices are the acceptable norm. The indications, contraindications, technique, and results of the above procedure should only be interpreted and judged by a Board-Certified Interventional Pain Specialist with extensive familiarity and expertise in the same exact procedure and technique.

## 2021-05-27 NOTE — Progress Notes (Signed)
Safety precautions to be maintained throughout the outpatient stay will include: orient to surroundings, keep bed in low position, maintain call bell within reach at all times, provide assistance with transfer out of bed and ambulation.  

## 2021-05-27 NOTE — Patient Instructions (Signed)
Pain Management Discharge Instructions  General Discharge Instructions :  If you need to reach your doctor call: Monday-Friday 8:00 am - 4:00 pm at [phone] or toll free [phone].  After clinic hours [phone] to have operator reach doctor.  Bring all of your medication bottles to all your appointments in the pain clinic.  To cancel or reschedule your appointment with Pain Management please remember to call 24 hours in advance to avoid a fee.  Refer to the educational materials which you have been given on: General Risks, I had my Procedure. Discharge Instructions, Post Sedation.  Post Procedure Instructions:  The drugs you were given will stay in your system until tomorrow, so for the next 24 hours you should not drive, make any legal decisions or drink any alcoholic beverages.  You may eat anything you prefer, but it is better to start with liquids then soups and crackers, and gradually work up to solid foods.  Please notify your doctor immediately if you have any unusual bleeding, trouble breathing or pain that is not related to your normal pain.  Depending on the type of procedure that was done, some parts of your body may feel week and/or numb.  This usually clears up by tonight or the next day.  Walk with the use of an assistive device or accompanied by an adult for the 24 hours.  You may use ice on the affected area for the first 24 hours.  Put ice in a Ziploc bag and cover with a towel and place against area 15 minutes on 15 minutes off.  You may switch to heat after 24 hours.Epidural Steroid Injection Patient Information  Description: The epidural space surrounds the nerves as they exit the spinal cord.  In some patients, the nerves can be compressed and inflamed by a bulging disc or a tight spinal canal (spinal stenosis).  By injecting steroids into the epidural space, we can bring irritated nerves into direct contact with a potentially helpful medication.  These  steroids act directly on the irritated nerves and can reduce swelling and inflammation which often leads to decreased pain.  Epidural steroids may be injected anywhere along the spine and from the neck to the low back depending upon the location of your pain.   After numbing the skin with local anesthetic (like Novocaine), a small needle is passed into the epidural space slowly.  You may experience a sensation of pressure while this is being done.  The entire block usually last less than 10 minutes.  Conditions which may be treated by epidural steroids:  Low back and leg pain Neck and arm pain Spinal stenosis Post-laminectomy syndrome Herpes zoster (shingles) pain Pain from compression fractures  Preparation for the injection:  Do not eat any solid food or dairy products within 8 hours of your appointment.  You may drink clear liquids up to 3 hours before appointment.  Clear liquids include water, black coffee, juice or soda.  No milk or cream please. You may take your regular medication, including pain medications, with a sip of water before your appointment  Diabetics should hold regular insulin (if taken separately) and take 1/2 normal NPH dos the morning of the procedure.  Carry some sugar containing items with you to your appointment. A driver must accompany you and be prepared to drive you home after your procedure.  Bring all your current medications with your. An IV may be inserted and sedation may be given at the discretion of the physician.     A blood pressure cuff, EKG and other monitors will often be applied during the procedure.  Some patients may need to have extra oxygen administered for a short period. You will be asked to provide medical information, including your allergies, prior to the procedure.  We must know immediately if you are taking blood thinners (like Coumadin/Warfarin)  Or if you are allergic to IV iodine contrast (dye). We must know if you could possible be  pregnant.  Possible side-effects: Bleeding from needle site Infection (rare, may require surgery) Nerve injury (rare) Numbness & tingling (temporary) Difficulty urinating (rare, temporary) Spinal headache ( a headache worse with upright posture) Light -headedness (temporary) Pain at injection site (several days) Decreased blood pressure (temporary) Weakness in arm/leg (temporary) Pressure sensation in back/neck (temporary)  Call if you experience: Fever/chills associated with headache or increased back/neck pain. Headache worsened by an upright position. New onset weakness or numbness of an extremity below the injection site Hives or difficulty breathing (go to the emergency room) Inflammation or drainage at the infection site Severe back/neck pain Any new symptoms which are concerning to you  Please note:  Although the local anesthetic injected can often make your back or neck feel good for several hours after the injection, the pain will likely return.  It takes 3-7 days for steroids to work in the epidural space.  You may not notice any pain relief for at least that one week.  If effective, we will often do a series of three injections spaced 3-6 weeks apart to maximally decrease your pain.  After the initial series, we generally will wait several months before considering a repeat injection of the same type.  If you have any questions, please call [phone] [facility] 

## 2021-05-28 ENCOUNTER — Telehealth: Payer: Self-pay

## 2021-05-28 NOTE — Telephone Encounter (Signed)
Post procedure phone call. Patient states she is doing good.  

## 2021-06-27 ENCOUNTER — Telehealth: Payer: Self-pay

## 2021-06-27 NOTE — Telephone Encounter (Signed)
LM for patient to call office to do pre virtual appointment questions.

## 2021-07-01 ENCOUNTER — Encounter: Payer: Self-pay | Admitting: Student in an Organized Health Care Education/Training Program

## 2021-07-01 ENCOUNTER — Ambulatory Visit
Payer: No Typology Code available for payment source | Attending: Student in an Organized Health Care Education/Training Program | Admitting: Student in an Organized Health Care Education/Training Program

## 2021-07-01 ENCOUNTER — Other Ambulatory Visit: Payer: Self-pay

## 2021-07-01 DIAGNOSIS — G894 Chronic pain syndrome: Secondary | ICD-10-CM | POA: Diagnosis not present

## 2021-07-01 DIAGNOSIS — M4802 Spinal stenosis, cervical region: Secondary | ICD-10-CM

## 2021-07-01 DIAGNOSIS — M5412 Radiculopathy, cervical region: Secondary | ICD-10-CM | POA: Diagnosis not present

## 2021-07-01 NOTE — Progress Notes (Signed)
Patient: Jessica Knight  Service Category: E/M  Provider: Gillis Santa, MD  DOB: [birth year]  DOS: 07/01/2021  Location: Office  MRN: [id number]  Setting: Ambulatory outpatient  Referring Provider: Mindi Curling, PA-C  Type: Established Patient  Specialty: Interventional Pain Management  PCP: Juanda Chance  Location: Home  Delivery: TeleHealth     Virtual Encounter - Pain Management PROVIDER NOTE: Information contained herein reflects review and annotations entered in association with encounter. Interpretation of such information and data should be left to medically-trained personnel. Information provided to patient can be located elsewhere in the medical record under "Patient Instructions". Document created using STT-dictation technology, any transcriptional errors that may result from process are unintentional.    Contact & Pharmacy Preferred: [phone] Home: [phone] (home) Mobile: [phone] (mobile) E-mail: [email or web address]  [id number] [address] [place] [address] Phone: [phone] Fax: [phone]   Pre-screening  Jessica Knight offered "in-person" vs "virtual" encounter. She indicated preferring virtual for this encounter.   Reason COVID-19*  Social distancing based on CDC and AMA recommendations.   I contacted Jessica Knight on 07/01/2021 via video conference.      I clearly identified myself as Gillis Santa, MD. I verified that I was speaking with the correct person using two identifiers (Name: Jessica Knight, and date of birth: 10-21-1981).  Consent I sought verbal advanced consent from Jessica Knight for virtual visit interactions. I informed Jessica Knight of possible security and privacy concerns, risks, and limitations associated with providing "not-in-person" medical evaluation and management services. I also informed Ms. Pagnotta of the  availability of "in-person" appointments. Finally, I informed her that there would be a charge for the virtual visit and that she could be  personally, fully or partially, financially responsible for it. Jessica Knight expressed understanding and agreed to proceed.   Historic Elements   Jessica Knight is a 40 y.o. year old, female patient evaluated today after our last contact on 05/27/2021. Jessica Knight  has a past medical history of Asthma, Diabetes mellitus without complication ([place]), and Thyroid disease. She also  has a past surgical history that includes Breast surgery; LASIK; and Hernia repair. Jessica Knight has a current medication list which includes the following prescription(s): albuterol sulfate, budesonide-formoterol, carboxymethylcellulose, cetirizine, fluticasone, ibuprofen, levothyroxine, lidocaine, loratadine, meloxicam, montelukast, multivitamin, and amoxicillin. She  reports that she has never smoked. She has never used smokeless tobacco. She reports previous alcohol use. She reports previous drug use. Jessica Knight has No Known Allergies.   HPI  Today, she is being contacted for a post-procedure assessment.   Post-Procedure Evaluation  Procedure (05/27/2021):   Type: Therapeutic, Inter-Laminar, Cervical Epidural Steroid Injection  #2  Region: Posterior Cervico-thoracic Region Level: C7-T1 Laterality: Right-Sided Paramedial  Anxiolysis: Please see nurses note.  Effectiveness during initial hour after procedure (Ultra-Short Term Relief): 100 %   Local anesthetic used: Long-acting (4-6 hours) Effectiveness: Defined as any analgesic benefit obtained secondary to the administration of local anesthetics. This carries significant diagnostic value as to the etiological location, or anatomical origin, of the pain. Duration of benefit is expected to coincide with the duration of the local anesthetic used.  Effectiveness during initial 4-6 hours after procedure (Short-Term Relief): 100 %    Long-term benefit: Defined as any relief past the pharmacologic duration of the local anesthetics.  Effectiveness past the initial 6 hours after procedure (Long-Term Relief): 60 % (lasting  about a month.)   Benefits, current: Defined as benefit present at the time of this evaluation.   Analgesia:  50% improved Function: Jessica Knight reports improvement in function ROM: Jessica Knight reports improvement in ROM    Laboratory Chemistry Profile   Renal Lab Results  Component Value Date   BUN 12 05/21/2020   CREATININE 1.00 05/21/2020   LABCREA 33 05/18/2020   GFRAA >60 05/21/2020   GFRNONAA >60 05/21/2020    Hepatic Lab Results  Component Value Date   AST 37 05/21/2020   ALT 16 05/21/2020   ALBUMIN 2.3 (L) 05/21/2020   ALKPHOS 146 (H) 05/21/2020    Electrolytes Lab Results  Component Value Date   NA 136 05/21/2020   K 3.8 05/21/2020   CL 109 05/21/2020   CALCIUM 7.4 (L) 05/21/2020   MG 5.8 (H) 05/20/2020    Bone No results found for: VD25OH, VD125OH2TOT, EX9371IR6, VE9381OF7, 25OHVITD1, 25OHVITD2, 25OHVITD3, TESTOFREE, TESTOSTERONE  Inflammation (CRP: Acute Phase) (ESR: Chronic Phase) No results found for: CRP, ESRSEDRATE, LATICACIDVEN       Note: Above Lab results reviewed.  Assessment  The primary encounter diagnosis was Cervical radicular pain (right C5/7). Diagnoses of Foraminal stenosis of cervical region and Chronic pain syndrome were also pertinent to this visit.  Plan of Care   Jessica Knight has a current medication list which includes the following long-term medication(s): albuterol sulfate, budesonide-formoterol, fluticasone, loratadine, and montelukast.  Jessica Knight states that she responded more favorably to her second cervical epidural steroid injection that she did her first 1.  She states after the first 3 weeks she had a significant reduction in her right neck and right arm pain however this last week, her symptoms have started to return.  During  the first 3 weeks she also noticed improvement in range of motion and overall functional status.  We discussed repeating cervical ESI #3.  We will plan on doing this without sedation as the patient does not have a driver available.  Orders:  Orders Placed This Encounter  Procedures   Cervical Epidural Injection    Level(s): C7-T1 Laterality: TBD Purpose: Diagnostic/Therapeutic Indication(s): Radiculitis and cervicalgia associater with cervical degenerative disc disease.    Standing Status:   Future    Standing Expiration Date:   08/01/2021    Scheduling Instructions:     Procedure: Cervical Epidural Steroid Injection/Block     Sedation: Patient's choice.     Timeframe: As soon as schedule allows    Order Specific Question:   Where will this procedure be performed?    Answer:   [facility]    Comments:   Concha Sudol   Follow-up plan:   Return in about 1 week (around 07/08/2021) for C-ESI #3 , without sedation.     Right C7-T1 ESI, bilateral sacroiliac joint injection 09/19/2020, Right C7-T1 ESI #2 05/27/21        Recent Visits Date Type Provider Dept  05/27/21 Procedure visit Gillis Santa, MD [facility]  05/06/21 Procedure visit Gillis Santa, MD [facility]  04/22/21 Office Visit Gillis Santa, MD [facility]  Showing recent visits within past 90 days and meeting all other requirements Today's Visits Date Type Provider Dept  07/01/21 Telemedicine Gillis Santa, MD [facility]  Showing today's visits and meeting all other requirements Future Appointments No visits were found meeting these conditions. Showing future appointments within next 90 days and meeting all other requirements I discussed the assessment and treatment plan with the  patient. The patient was provided an opportunity to ask questions and all were answered. The patient agreed with the plan and demonstrated an understanding of the instructions.  Patient advised to call  back or seek an in-person evaluation if the symptoms or condition worsens.  Duration of encounter: 8mnutes.  Note by: BGillis Santa MD Date: 07/01/2021; Time: 3:16 PM

## 2021-07-08 ENCOUNTER — Other Ambulatory Visit: Payer: Self-pay

## 2021-07-08 ENCOUNTER — Ambulatory Visit (HOSPITAL_BASED_OUTPATIENT_CLINIC_OR_DEPARTMENT_OTHER)
Payer: No Typology Code available for payment source | Admitting: Student in an Organized Health Care Education/Training Program

## 2021-07-08 ENCOUNTER — Encounter: Payer: Self-pay | Admitting: Student in an Organized Health Care Education/Training Program

## 2021-07-08 ENCOUNTER — Ambulatory Visit
Admission: RE | Admit: 2021-07-08 | Discharge: 2021-07-08 | Disposition: A | Discharge status: Home / Self Care | Payer: No Typology Code available for payment source | Source: Ambulatory Visit | Attending: Student in an Organized Health Care Education/Training Program | Admitting: Student in an Organized Health Care Education/Training Program

## 2021-07-08 VITALS — BP 111/73 | HR 66 | Temp 97.0°F | Resp 16 | Ht 64.0 in | Wt 174.0 lb

## 2021-07-08 DIAGNOSIS — G894 Chronic pain syndrome: Secondary | ICD-10-CM | POA: Diagnosis present

## 2021-07-08 DIAGNOSIS — M4802 Spinal stenosis, cervical region: Secondary | ICD-10-CM | POA: Diagnosis present

## 2021-07-08 DIAGNOSIS — M5412 Radiculopathy, cervical region: Secondary | ICD-10-CM | POA: Diagnosis not present

## 2021-07-08 MED ORDER — LIDOCAINE HCL 2 % IJ SOLN
20.0000 mL | Freq: Once | INTRAMUSCULAR | Status: AC
  Administered 2021-07-08: 200 mg

## 2021-07-08 MED ORDER — ROPIVACAINE HCL 2 MG/ML IJ SOLN
INTRAMUSCULAR | Status: AC
  Filled 2021-07-08: qty 20

## 2021-07-08 MED ORDER — SODIUM CHLORIDE (PF) 0.9 % IJ SOLN
INTRAMUSCULAR | Status: AC
  Filled 2021-07-08: qty 10

## 2021-07-08 MED ORDER — DEXAMETHASONE SODIUM PHOSPHATE 10 MG/ML IJ SOLN
10.0000 mg | Freq: Once | INTRAMUSCULAR | Status: AC
  Administered 2021-07-08: 10 mg

## 2021-07-08 MED ORDER — SODIUM CHLORIDE 0.9% FLUSH
1.0000 mL | Freq: Once | INTRAVENOUS | Status: AC
  Administered 2021-07-08: 1 mL

## 2021-07-08 MED ORDER — IOHEXOL 180 MG/ML  SOLN
10.0000 mL | Freq: Once | INTRAMUSCULAR | Status: AC
  Administered 2021-07-08: 5 mL via EPIDURAL

## 2021-07-08 MED ORDER — LIDOCAINE HCL 2 % IJ SOLN
INTRAMUSCULAR | Status: AC
  Filled 2021-07-08: qty 10

## 2021-07-08 MED ORDER — ROPIVACAINE HCL 2 MG/ML IJ SOLN
1.0000 mL | Freq: Once | INTRAMUSCULAR | Status: AC
  Administered 2021-07-08: 1 mL via EPIDURAL

## 2021-07-08 MED ORDER — DEXAMETHASONE SODIUM PHOSPHATE 10 MG/ML IJ SOLN
INTRAMUSCULAR | Status: AC
  Filled 2021-07-08: qty 1

## 2021-07-08 NOTE — Progress Notes (Signed)
PROVIDER NOTE: Information contained herein reflects review and annotations entered in association with encounter. Interpretation of such information and data should be left to medically-trained personnel. Information provided to patient can be located elsewhere in the medical record under "Patient Instructions". Document created using STT-dictation technology, any transcriptional errors that may result from process are unintentional.    Patient: Jessica Knight  Service Category: Procedure  Provider: Edward Jolly, MD  DOB: 1981-05-27  DOS: 07/08/2021  Location: [facility]  MRN: [id number]  Setting: Ambulatory - outpatient  Referring Provider: Jordan Hawks, PA-C  Type: Established Patient  Specialty: Interventional Pain Management  PCP: Jordan Hawks, PA-C   Primary Reason for Visit: Interventional Pain Management Treatment. CC: Neck Pain (Mid - right )  Procedure:          Anesthesia, Analgesia, Anxiolysis:  Type: Therapeutic, Inter-Laminar, Cervical Epidural Steroid Injection  #3  Region: Posterior Cervico-thoracic Region Level: C7-T1 Laterality: Midline Paramedial  Type: Local Anesthesia   Local Anesthetic: Lidocaine 1-2%  Position: Prone with head of the table was raised to facilitate breathing.   Indications: 1. Cervical radicular pain (right C5/7)   2. Foraminal stenosis of cervical region   3. Chronic pain syndrome    Pain Score: Pre-procedure: 6 /10 Post-procedure: 6 /10   Pre-op H&P Assessment:  Jessica Knight is a 40 y.o. (year old), female patient, seen today for interventional treatment. She  has a past surgical history that includes Breast surgery; LASIK; and Hernia repair. Jessica Knight has a current medication list which includes the following prescription(s): albuterol sulfate, budesonide-formoterol, carboxymethylcellulose, cetirizine, fluticasone, ibuprofen, levothyroxine, lidocaine, loratadine, meloxicam, montelukast, multivitamin, and amoxicillin. Her  primarily concern today is the Neck Pain (Mid - right )  Initial Vital Signs:  Pulse/HCG Rate: 66ECG Heart Rate: (!) 59 Temp: (!) 97 F (36.1 C) Resp: 16 BP: 112/85 SpO2: 100 %  BMI: Estimated body mass index is 29.87 kg/m as calculated from the following:   Height as of this encounter: 5\' 4"  (1.626 m).   Weight as of this encounter: 174 lb (78.9 kg).  Risk Assessment: Allergies: Reviewed. She has No Known Allergies.  Allergy Precautions: None required Coagulopathies: Reviewed. None identified.  Blood-thinner therapy: None at this time Active Infection(s): Reviewed. None identified. Jessica Knight is afebrile  Site Confirmation: Jessica Knight was asked to confirm the procedure and laterality before marking the site Procedure checklist: Completed Consent: Before the procedure and under the influence of no sedative(s), amnesic(s), or anxiolytics, the patient was informed of the treatment options, risks and possible complications. To fulfill our ethical and legal obligations, as recommended by the American Medical Association's Code of Ethics, I have informed the patient of my clinical impression; the nature and purpose of the treatment or procedure; the risks, benefits, and possible complications of the intervention; the alternatives, including doing nothing; the risk(s) and benefit(s) of the alternative treatment(s) or procedure(s); and the risk(s) and benefit(s) of doing nothing. The patient was provided information about the general risks and possible complications associated with the procedure. These may include, but are not limited to: failure to achieve desired goals, infection, bleeding, organ or nerve damage, allergic reactions, paralysis, and death. In addition, the patient was informed of those risks and complications associated to Spine-related procedures, such as failure to decrease pain; infection (i.e.: Meningitis, epidural or intraspinal abscess); bleeding (i.e.: epidural hematoma,  subarachnoid hemorrhage, or any other type of intraspinal or peri-dural bleeding); organ or nerve damage (i.e.: Any type of peripheral nerve,  nerve root, or spinal cord injury) with subsequent damage to sensory, motor, and/or autonomic systems, resulting in permanent pain, numbness, and/or weakness of one or several areas of the body; allergic reactions; (i.e.: anaphylactic reaction); and/or death. Furthermore, the patient was informed of those risks and complications associated with the medications. These include, but are not limited to: allergic reactions (i.e.: anaphylactic or anaphylactoid reaction(s)); adrenal axis suppression; blood sugar elevation that in diabetics may result in ketoacidosis or comma; water retention that in patients with history of congestive heart failure may result in shortness of breath, pulmonary edema, and decompensation with resultant heart failure; weight gain; swelling or edema; medication-induced neural toxicity; particulate matter embolism and blood vessel occlusion with resultant organ, and/or nervous system infarction; and/or aseptic necrosis of one or more joints. Finally, the patient was informed that Medicine is not an exact science; therefore, there is also the possibility of unforeseen or unpredictable risks and/or possible complications that may result in a catastrophic outcome. The patient indicated having understood very clearly. We have given the patient no guarantees and we have made no promises. Enough time was given to the patient to ask questions, all of which were answered to the patient's satisfaction. Jessica Knight has indicated that she wanted to continue with the procedure. Attestation: I, the ordering provider, attest that I have discussed with the patient the benefits, risks, side-effects, alternatives, likelihood of achieving goals, and potential problems during recovery for the procedure that I have provided informed consent. Date  Time: 07/08/2021 10:48  AM  Pre-Procedure Preparation:  Monitoring: As per clinic protocol. Respiration, ETCO2, SpO2, BP, heart rate and rhythm monitor placed and checked for adequate function Safety Precautions: Patient was assessed for positional comfort and pressure points before starting the procedure. Time-out: I initiated and conducted the "Time-out" before starting the procedure, as per protocol. The patient was asked to participate by confirming the accuracy of the "Time Out" information. Verification of the correct person, site, and procedure were performed and confirmed by me, the nursing staff, and the patient. "Time-out" conducted as per Joint Commission's Universal Protocol (UP.01.01.01). Time: 1118  Description of Procedure:          Target Area: For Epidural Steroid injections the target is the interlaminar space, initially targeting the lower border of the superior vertebral body lamina. Approach: Paramedial approach. Area Prepped: Entire PosteriorCervical Region DuraPrep (Iodine Povacrylex [0.7% available iodine] and Isopropyl Alcohol, 74% w/w) Safety Precautions: Aspiration looking for blood return was conducted prior to all injections. At no point did we inject any substances, as a needle was being advanced. No attempts were made at seeking any paresthesias. Safe injection practices and needle disposal techniques used. Medications properly checked for expiration dates. SDV (single dose vial) medications used. Description of the Procedure: Protocol guidelines were followed. The procedure needle was introduced through the skin, ipsilateral to the reported pain, and advanced to the target area. Bone was contacted and the needle walked caudad, until the lamina was cleared. The epidural space was identified using "loss-of-resistance technique" with 2-3 ml of PF-NaCl (0.9% NSS), in a 5cc LOR glass syringe. Vitals:   07/08/21 1049 07/08/21 1117 07/08/21 1123  BP: 112/85 107/75 111/73  Pulse: 66    Resp: 16 18  16   Temp: (!) 97 F (36.1 C)    TempSrc: Temporal    SpO2: 100% 100% 99%  Weight: 174 lb (78.9 kg)    Height: 5\' 4"  (1.626 m)      Start Time: 1118 hrs. End  Time: 1123 hrs. Materials:  Needle(s) Type: Epidural needle Gauge: 22G Length: 3.5-in Medication(s): Please see orders for medications and dosing details. 3.5 cc solution made of 1 cc of preservative-free saline, 1.5 cc of 0.2% ropivacaine, 1 cc of Decadron 10 mg/cc.  Imaging Guidance (Spinal):          Type of Imaging Technique: Fluoroscopy Guidance (Spinal) Indication(s): Assistance in needle guidance and placement for procedures requiring needle placement in or near specific anatomical locations not easily accessible without such assistance. Exposure Time: Please see nurses notes. Contrast: Before injecting any contrast, we confirmed that the patient did not have an allergy to iodine, shellfish, or radiological contrast. Once satisfactory needle placement was completed at the desired level, radiological contrast was injected. Contrast injected under live fluoroscopy. No contrast complications. See chart for type and volume of contrast used. Fluoroscopic Guidance: I was personally present during the use of fluoroscopy. "Tunnel Vision Technique" used to obtain the best possible view of the target area. Parallax error corrected before commencing the procedure. "Direction-depth-direction" technique used to introduce the needle under continuous pulsed fluoroscopy. Once target was reached, antero-posterior, oblique, and lateral fluoroscopic projection used confirm needle placement in all planes. Images permanently stored in EMR. Interpretation: I personally interpreted the imaging intraoperatively. Adequate needle placement confirmed in multiple planes. Appropriate spread of contrast into desired area was observed. No evidence of afferent or efferent intravascular uptake. No intrathecal or subarachnoid spread observed. Permanent images saved  into the patient's record.   Post-operative Assessment:  Post-procedure Vital Signs:  Pulse/HCG Rate: 66(!) 57 Temp: (!) 97 F (36.1 C) Resp: 16 BP: 111/73 SpO2: 99 %  EBL: None  Complications: No immediate post-treatment complications observed by team, or reported by patient.  Note: The patient tolerated the entire procedure well. A repeat set of vitals were taken after the procedure and the patient was kept under observation following institutional policy, for this type of procedure. Post-procedural neurological assessment was performed, showing return to baseline, prior to discharge. The patient was provided with post-procedure discharge instructions, including a section on how to identify potential problems. Should any problems arise concerning this procedure, the patient was given instructions to immediately contact us, at any time, without hesitation. In any case, we plan to contact the patient by telephone for a follow-up status report regarding this interventional procedure.  Comments:  No additional relevant information.  Plan of Care  Orders:  Orders Placed This Encounter  Procedures   DG PA[facility] C-ARM 1-60 MIN NO REPORT    Intraoperative interpretation by procedural physician at [facility].    Standing Status:   Standing    Number of Occurrences:   1    Order Specific Question:   Reason for exam:    Answer:   Assistance in needle guidance and placement for procedures requiring needle placement in or near specific anatomical locations not easily accessible without such assistance.    5 out of 5 strength bilateral upper extremity: Shoulder abduction, elbow flexion, elbow extension, thumb extension.   Medications ordered for procedure: Meds ordered this encounter  Medications   iohexol (OMNIPAQUE) 180 MG/ML injection 10 mL    Must be Myelogram-compatible. If not available, you may substitute with a water-soluble, non-ionic, hypoallergenic,  myelogram-compatible radiological contrast medium.   lidocaine (XYLOCAINE) 2 % (with pres) injection 400 mg   ropivacaine (PF) 2 mg/mL (0.2%) (NAROPIN) injection 1 mL   sodium chloride flush (NS) 0.9 % injection 1 mL   dexamethasone (DECADRON) injection  10 mg    Medications administered: We administered iohexol, lidocaine, ropivacaine (PF) 2 mg/mL (0.2%), sodium chloride flush, and dexamethasone.  See the medical record for exact dosing, route, and time of administration.  Follow-up plan:   No follow-ups on file.      Right C7-T1 ESI, bilateral sacroiliac joint injection 09/19/2020, Right C7-T1 ESI #2 05/27/21, #3 07/08/21       Recent Visits Date Type Provider Dept  07/01/21 Telemedicine Edward Jolly, MD [facility]  05/27/21 Procedure visit Edward Jolly, MD [facility]  05/06/21 Procedure visit Edward Jolly, MD [facility]  04/22/21 Office Visit Edward Jolly, MD [facility]  Showing recent visits within past 90 days and meeting all other requirements Today's Visits Date Type Provider Dept  07/08/21 Procedure visit Edward Jolly, MD [facility]  Showing today's visits and meeting all other requirements Future Appointments No visits were found meeting these conditions. Showing future appointments within next 90 days and meeting all other requirements Disposition: Discharge home  Discharge (Date  Time): 07/08/2021; 1135 hrs.   Primary Care Physician: Barbarann Ehlers Location: [facility] Note by: Edward Jolly, MD Date: 07/08/2021; Time: 11:37 AM  Disclaimer:  Medicine is not an exact science. The only guarantee in medicine is that nothing is guaranteed. It is important to note that the decision to proceed with this intervention was based on the information collected from the patient. The Data and conclusions were drawn from the patient's questionnaire, the interview, and the physical examination.  Because the information was provided in large part by the patient, it cannot be guaranteed that it has not been purposely or unconsciously manipulated. Every effort has been made to obtain as much relevant data as possible for this evaluation. It is important to note that the conclusions that lead to this procedure are derived in large part from the available data. Always take into account that the treatment will also be dependent on availability of resources and existing treatment guidelines, considered by other Pain Management Practitioners as being common knowledge and practice, at the time of the intervention. For Medico-Legal purposes, it is also important to point out that variation in procedural techniques and pharmacological choices are the acceptable norm. The indications, contraindications, technique, and results of the above procedure should only be interpreted and judged by a Board-Certified Interventional Pain Specialist with extensive familiarity and expertise in the same exact procedure and technique.

## 2021-07-08 NOTE — Progress Notes (Signed)
Safety precautions to be maintained throughout the outpatient stay will include: orient to surroundings, keep bed in low position, maintain call bell within reach at all times, provide assistance with transfer out of bed and ambulation.  

## 2021-07-08 NOTE — Patient Instructions (Signed)
Pain Management Discharge Instructions  General Discharge Instructions :  If you need to reach your doctor call: Monday-Friday 8:00 am - 4:00 pm at [phone] or toll free [phone].  After clinic hours [phone] to have operator reach doctor.  Bring all of your medication bottles to all your appointments in the pain clinic.  To cancel or reschedule your appointment with Pain Management please remember to call 24 hours in advance to avoid a fee.  Refer to the educational materials which you have been given on: General Risks, I had my Procedure. Discharge Instructions, Post Sedation.  Post Procedure Instructions:  The drugs you were given will stay in your system until tomorrow, so for the next 24 hours you should not drive, make any legal decisions or drink any alcoholic beverages.  You may eat anything you prefer, but it is better to start with liquids then soups and crackers, and gradually work up to solid foods.  Please notify your doctor immediately if you have any unusual bleeding, trouble breathing or pain that is not related to your normal pain.  Depending on the type of procedure that was done, some parts of your body may feel week and/or numb.  This usually clears up by tonight or the next day.  Walk with the use of an assistive device or accompanied by an adult for the 24 hours.  You may use ice on the affected area for the first 24 hours.  Put ice in a Ziploc bag and cover with a towel and place against area 15 minutes on 15 minutes off.  You may switch to heat after 24 hours.Epidural Steroid Injection Patient Information  Description: The epidural space surrounds the nerves as they exit the spinal cord.  In some patients, the nerves can be compressed and inflamed by a bulging disc or a tight spinal canal (spinal stenosis).  By injecting steroids into the epidural space, we can bring irritated nerves into direct contact with a potentially helpful medication.  These  steroids act directly on the irritated nerves and can reduce swelling and inflammation which often leads to decreased pain.  Epidural steroids may be injected anywhere along the spine and from the neck to the low back depending upon the location of your pain.   After numbing the skin with local anesthetic (like Novocaine), a small needle is passed into the epidural space slowly.  You may experience a sensation of pressure while this is being done.  The entire block usually last less than 10 minutes.  Conditions which may be treated by epidural steroids:  Low back and leg pain Neck and arm pain Spinal stenosis Post-laminectomy syndrome Herpes zoster (shingles) pain Pain from compression fractures  Preparation for the injection:  Do not eat any solid food or dairy products within 8 hours of your appointment.  You may drink clear liquids up to 3 hours before appointment.  Clear liquids include water, black coffee, juice or soda.  No milk or cream please. You may take your regular medication, including pain medications, with a sip of water before your appointment  Diabetics should hold regular insulin (if taken separately) and take 1/2 normal NPH dos the morning of the procedure.  Carry some sugar containing items with you to your appointment. A driver must accompany you and be prepared to drive you home after your procedure.  Bring all your current medications with your. An IV may be inserted and sedation may be given at the discretion of the physician.     A blood pressure cuff, EKG and other monitors will often be applied during the procedure.  Some patients may need to have extra oxygen administered for a short period. You will be asked to provide medical information, including your allergies, prior to the procedure.  We must know immediately if you are taking blood thinners (like Coumadin/Warfarin)  Or if you are allergic to IV iodine contrast (dye). We must know if you could possible be  pregnant.  Possible side-effects: Bleeding from needle site Infection (rare, may require surgery) Nerve injury (rare) Numbness & tingling (temporary) Difficulty urinating (rare, temporary) Spinal headache ( a headache worse with upright posture) Light -headedness (temporary) Pain at injection site (several days) Decreased blood pressure (temporary) Weakness in arm/leg (temporary) Pressure sensation in back/neck (temporary)  Call if you experience: Fever/chills associated with headache or increased back/neck pain. Headache worsened by an upright position. New onset weakness or numbness of an extremity below the injection site Hives or difficulty breathing (go to the emergency room) Inflammation or drainage at the infection site Severe back/neck pain Any new symptoms which are concerning to you  Please note:  Although the local anesthetic injected can often make your back or neck feel good for several hours after the injection, the pain will likely return.  It takes 3-7 days for steroids to work in the epidural space.  You may not notice any pain relief for at least that one week.  If effective, we will often do a series of three injections spaced 3-6 weeks apart to maximally decrease your pain.  After the initial series, we generally will wait several months before considering a repeat injection of the same type.  If you have any questions, please call [phone] [facility] 

## 2021-07-09 ENCOUNTER — Telehealth: Payer: Self-pay | Admitting: *Deleted

## 2021-07-09 NOTE — Telephone Encounter (Signed)
No problems post procedure. 

## 2021-09-03 ENCOUNTER — Other Ambulatory Visit: Payer: Self-pay

## 2021-09-03 ENCOUNTER — Ambulatory Visit
Payer: No Typology Code available for payment source | Attending: Student in an Organized Health Care Education/Training Program | Admitting: Student in an Organized Health Care Education/Training Program

## 2021-09-03 DIAGNOSIS — M5412 Radiculopathy, cervical region: Secondary | ICD-10-CM | POA: Diagnosis not present

## 2021-09-03 DIAGNOSIS — M4802 Spinal stenosis, cervical region: Secondary | ICD-10-CM

## 2021-09-03 DIAGNOSIS — G894 Chronic pain syndrome: Secondary | ICD-10-CM

## 2021-09-03 DIAGNOSIS — M5136 Other intervertebral disc degeneration, lumbar region: Secondary | ICD-10-CM

## 2021-09-03 DIAGNOSIS — M5416 Radiculopathy, lumbar region: Secondary | ICD-10-CM

## 2021-09-03 DIAGNOSIS — M542 Cervicalgia: Secondary | ICD-10-CM

## 2021-09-03 NOTE — Progress Notes (Signed)
Patient: Jessica Knight  Service Category: E/M  Provider: Gillis Santa, MD  DOB: 1981-08-16  DOS: 09/03/2021  Location: Office  MRN: [id number]  Setting: Ambulatory outpatient  Referring Provider: Mindi Curling, PA-C  Type: Established Patient  Specialty: Interventional Pain Management  PCP: Mindi Curling, PA-C  Location: Remote location  Delivery: TeleHealth     Virtual Encounter - Pain Management PROVIDER NOTE: Information contained herein reflects review and annotations entered in association with encounter. Interpretation of such information and data should be left to medically-trained personnel. Information provided to patient can be located elsewhere in the medical record under "Patient Instructions". Document created using STT-dictation technology, any transcriptional errors that may result from process are unintentional.    Contact & Pharmacy Preferred: [phone] Home: [phone] (home) Mobile: [phone] (mobile) E-mail: [email or web address]  [id number] [address], [address] [place] [address] Phone: [phone] Fax: [phone]   Pre-screening  Jessica Knight offered "in-person" vs "virtual" encounter. She indicated preferring virtual for this encounter.   Reason COVID-19*  Social distancing based on CDC and AMA recommendations.   I contacted Jessica Knight on 09/03/2021 via telephone.      I clearly identified myself as Gillis Santa, MD. I verified that I was speaking with the correct person using two identifiers (Name: Jessica Knight, and date of birth: 1981-03-08).  Consent I sought verbal advanced consent from Jessica Knight for virtual visit interactions. I informed Jessica Knight of possible security and privacy concerns, risks, and limitations associated with providing "not-in-person" medical evaluation and management services. I also informed Jessica Knight of the  availability of "in-person" appointments. Finally, I informed her that there would be a charge for the virtual visit and that she could be  personally, fully or partially, financially responsible for it. Jessica Knight expressed understanding and agreed to proceed.   Historic Elements   Jessica Knight is a 40 y.o. year old, female patient evaluated today after our last contact on 07/08/2021. Jessica Knight  has a past medical history of Asthma, Diabetes mellitus without complication ([place]), and Thyroid disease. She also  has a past surgical history that includes Breast surgery; LASIK; and Hernia repair. Jessica Knight has a current medication list which includes the following prescription(s): albuterol sulfate, budesonide-formoterol, carboxymethylcellulose, cetirizine, fluticasone, ibuprofen, levothyroxine, lidocaine, loratadine, medroxyprogesterone, meloxicam, montelukast, multivitamin, and amoxicillin. She  reports that she has never smoked. She has never used smokeless tobacco. She reports that she does not currently use alcohol. She reports that she does not currently use drugs. Jessica Knight has No Known Allergies.   HPI  Today, she is being contacted for postprocedural evaluation and worsening of previously established problem.   Post-Procedure Evaluation  Procedure (07/08/2021):    Type: Therapeutic, Inter-Laminar, Cervical Epidural Steroid Injection  #3  Region: Posterior Cervico-thoracic Region Level: C7-T1 Laterality: Midline Paramedial  Anxiolysis: Please see nurses note.  Effectiveness during initial hour after procedure (Ultra-Short Term Relief): 100 %   Local anesthetic used: Long-acting (4-6 hours) Effectiveness: Defined as any analgesic benefit obtained secondary to the administration of local anesthetics. This carries significant diagnostic value as to the etiological location, or anatomical origin, of the pain. Duration of benefit is expected to coincide with the duration of the local  anesthetic used.  Effectiveness during initial 4-6 hours after procedure (Short-Term Relief): 100 %   Long-term benefit: Defined as any relief past the pharmacologic duration of the local  anesthetics.  Effectiveness past the initial 6 hours after procedure (Long-Term Relief): 100 % (patient reports that she had good pain relief x 5 days and then it was as if she had not recieved any injection.)   Benefits, current: Defined as benefit present at the time of this evaluation.   Analgesia: Back to baseline   Laboratory Chemistry Profile   Renal Lab Results  Component Value Date   BUN 12 05/21/2020   CREATININE 1.00 05/21/2020   LABCREA 33 05/18/2020   GFRAA >60 05/21/2020   GFRNONAA >60 05/21/2020    Hepatic Lab Results  Component Value Date   AST 37 05/21/2020   ALT 16 05/21/2020   ALBUMIN 2.3 (L) 05/21/2020   ALKPHOS 146 (H) 05/21/2020    Electrolytes Lab Results  Component Value Date   NA 136 05/21/2020   K 3.8 05/21/2020   CL 109 05/21/2020   CALCIUM 7.4 (L) 05/21/2020   MG 5.8 (H) 05/20/2020    Bone No results found for: VD25OH, VD125OH2TOT, BC4888BV6, XI5038UE2, 25OHVITD1, 25OHVITD2, 25OHVITD3, TESTOFREE, TESTOSTERONE  Inflammation (CRP: Acute Phase) (ESR: Chronic Phase) No results found for: CRP, ESRSEDRATE, LATICACIDVEN       Note: Above Lab results reviewed.   Assessment  The primary encounter diagnosis was Cervical radicular pain (right C5/7). Diagnoses of Foraminal stenosis of cervical region, Lumbar radiculopathy (Right L5 EMG/NCV), Lumbar degenerative disc disease, Chronic pain syndrome, Cervicalgia, and Other intervertebral disc degeneration, lumbar region were also pertinent to this visit.  Plan of Care    Jessica Knight has a current medication list which includes the following long-term medication(s): albuterol sulfate, budesonide-formoterol, fluticasone, loratadine, medroxyprogesterone, and montelukast.   Unfortunately, Jessica Knight continues to  struggle with cervical spines pain that radiates into her right upper extremity in a dermatomal fashion.  She is status post 3 cervical epidural steroid injections with me with the last 1 being on 07/08/2021.  She states that she had 100% pain relief for 5 days but after that pain returned back to preinjection levels.  She also struggles with low back pain with radiation down her right leg in a dermatomal fashion.  She has right L5 radiculopathy.  She has done epidural injections in the past in [place] with limited response.  Given persistent pain that is worsening, I recommend cervical MRI and lumbar MRI for work-up.  Depending upon results, patient may need an evaluation by neurosurgeon to consider surgery versus discussion of neuromodulation, perhaps lumbar spinal cord stimulator trial given that she has not responded to physical therapy, medication management, spinal injections.  Orders:  Orders Placed This Encounter  Procedures   MR CERVICAL SPINE WO CONTRAST    Patient presents with axial pain with possible radicular component. Please assist [place] in identifying specific level(s) and laterality of any additional findings such as: 1. Facet (Zygapophyseal) joint DJD (Hypertrophy, space narrowing, subchondral sclerosis, and/or osteophyte formation) 2. DDD and/or IVDD (Loss of disc height, desiccation, gas patterns, osteophytes, endplate sclerosis, or "Black disc disease") 3. Pars defects 4. Spondylolisthesis, spondylosis, and/or spondyloarthropathies (include Degree/Grade of displacement in mm) (stability) 5. Vertebral body Fractures (acute/chronic) (state percentage of collapse) 6. Demineralization (osteopenia/osteoporotic) 7. Bone pathology 8. Foraminal narrowing  9. Surgical changes 10. Central, Lateral Recess, and/or Foraminal Stenosis (include AP diameter of stenosis in mm) 11. Surgical changes (hardware type, status, and presence of fibrosis) 12. Modic Type Changes (MRI only) 13. IVDD (Disc  bulge, protrusion, herniation, extrusion) (Level, laterality, extent)    Standing Status:   Future  Standing Expiration Date:   10/04/2021    Scheduling Instructions:     Imaging must be done as soon as possible. Inform patient that order will expire within 30 days and I will not renew it.    Order Specific Question:   What is the patient's sedation requirement?    Answer:   No Sedation    Order Specific Question:   Does the patient have a pacemaker or implanted devices?    Answer:   No    Order Specific Question:   Preferred imaging location?    Answer:   [facility] (table limit-350lbs)    Order Specific Question:   Call Results- Best Contact Number?    Answer:   [phone] ([facility])    Order Specific Question:   Radiology Contrast Protocol - do NOT remove file path    Answer:   \\[email or web address]   MR LUMBAR SPINE WO CONTRAST    Patient presents with axial pain with possible radicular component. Please assist [place] in identifying specific level(s) and laterality of any additional findings such as: 1. Facet (Zygapophyseal) joint DJD (Hypertrophy, space narrowing, subchondral sclerosis, and/or osteophyte formation) 2. DDD and/or IVDD (Loss of disc height, desiccation, gas patterns, osteophytes, endplate sclerosis, or "Black disc disease") 3. Pars defects 4. Spondylolisthesis, spondylosis, and/or spondyloarthropathies (include Degree/Grade of displacement in mm) (stability) 5. Vertebral body Fractures (acute/chronic) (state percentage of collapse) 6. Demineralization (osteopenia/osteoporotic) 7. Bone pathology 8. Foraminal narrowing  9. Surgical changes 10. Central, Lateral Recess, and/or Foraminal Stenosis (include AP diameter of stenosis in mm) 11. Surgical changes (hardware type, status, and presence of fibrosis) 12. Modic Type Changes (MRI only) 13. IVDD (Disc bulge, protrusion, herniation, extrusion) (Level, laterality, extent)     Standing Status:   Future    Standing Expiration Date:   10/04/2021    Scheduling Instructions:     Imaging must be done as soon as possible. Inform patient that order will expire within 30 days and I will not renew it.    Order Specific Question:   What is the patient's sedation requirement?    Answer:   No Sedation    Order Specific Question:   Does the patient have a pacemaker or implanted devices?    Answer:   No    Order Specific Question:   Preferred imaging location?    Answer:   [facility] (table limit-350lbs)    Order Specific Question:   Call Results- Best Contact Number?    Answer:   [phone] ([facility])    Order Specific Question:   Radiology Contrast Protocol - do NOT remove file path    Answer:   \\[email or web address]    Follow-up plan:   Return for Will call patient with MRI results and to discuss treatment plan.     Right C7-T1 ESI, bilateral sacroiliac joint injection 09/19/2020, Right C7-T1 ESI #2 05/27/21, #3 07/08/21        Recent Visits Date Type Provider Dept  07/08/21 Procedure visit Gillis Santa, MD [facility]  07/01/21 Telemedicine Gillis Santa, MD [facility]  Showing recent visits within past 90 days and meeting all other requirements Today's Visits Date Type Provider Dept  09/03/21 Office Visit Gillis Santa, MD [facility]  Showing today's visits and meeting all other requirements Future Appointments No visits were found meeting these conditions. Showing future appointments within next 90 days and meeting all other requirements I discussed the assessment and treatment plan with the patient. The patient  was provided an opportunity to ask questions and all were answered. The patient agreed with the plan and demonstrated an understanding of the instructions.  Patient advised to call back or seek an in-person evaluation if the symptoms or condition worsens.  Duration of encounter:  32mnutes.  Note by: BGillis Santa MD Date: 09/03/2021; Time: 2:06 PM

## 2021-09-13 ENCOUNTER — Other Ambulatory Visit: Payer: No Typology Code available for payment source

## 2021-09-18 ENCOUNTER — Ambulatory Visit
Admission: RE | Admit: 2021-09-18 | Discharge: 2021-09-18 | Disposition: A | Discharge status: Home / Self Care | Payer: No Typology Code available for payment source | Source: Ambulatory Visit | Attending: Student in an Organized Health Care Education/Training Program | Admitting: Student in an Organized Health Care Education/Training Program

## 2021-09-18 ENCOUNTER — Other Ambulatory Visit: Payer: Self-pay

## 2021-09-18 DIAGNOSIS — M542 Cervicalgia: Secondary | ICD-10-CM | POA: Diagnosis present

## 2021-09-18 DIAGNOSIS — M5136 Other intervertebral disc degeneration, lumbar region: Secondary | ICD-10-CM | POA: Diagnosis present

## 2021-09-18 DIAGNOSIS — M5412 Radiculopathy, cervical region: Secondary | ICD-10-CM | POA: Diagnosis not present

## 2021-09-18 DIAGNOSIS — M4802 Spinal stenosis, cervical region: Secondary | ICD-10-CM | POA: Insufficient documentation

## 2021-09-18 DIAGNOSIS — G894 Chronic pain syndrome: Secondary | ICD-10-CM | POA: Diagnosis present

## 2021-09-18 DIAGNOSIS — M5416 Radiculopathy, lumbar region: Secondary | ICD-10-CM | POA: Diagnosis present

## 2021-09-25 ENCOUNTER — Ambulatory Visit
Payer: No Typology Code available for payment source | Attending: Student in an Organized Health Care Education/Training Program | Admitting: Student in an Organized Health Care Education/Training Program

## 2021-09-25 ENCOUNTER — Other Ambulatory Visit: Payer: Self-pay

## 2021-09-25 DIAGNOSIS — M7918 Myalgia, other site: Secondary | ICD-10-CM

## 2021-09-25 DIAGNOSIS — M5136 Other intervertebral disc degeneration, lumbar region: Secondary | ICD-10-CM

## 2021-09-25 DIAGNOSIS — M5412 Radiculopathy, cervical region: Secondary | ICD-10-CM

## 2021-09-25 DIAGNOSIS — G894 Chronic pain syndrome: Secondary | ICD-10-CM

## 2021-09-25 DIAGNOSIS — M4802 Spinal stenosis, cervical region: Secondary | ICD-10-CM

## 2021-09-25 DIAGNOSIS — M5416 Radiculopathy, lumbar region: Secondary | ICD-10-CM | POA: Diagnosis not present

## 2021-09-25 DIAGNOSIS — M542 Cervicalgia: Secondary | ICD-10-CM

## 2021-09-25 NOTE — Progress Notes (Signed)
Patient: Jessica Knight  Service Category: E/M  Provider: Gillis Santa, MD  DOB: 15-Feb-1981  DOS: 09/25/2021  Location: Office  MRN: [id number]  Setting: Ambulatory outpatient  Referring Provider: Mindi Curling, PA-C  Type: Established Patient  Specialty: Interventional Pain Management  PCP: Mindi Curling, PA-C  Location: Remote location  Delivery: TeleHealth     Virtual Encounter - Pain Management PROVIDER NOTE: Information contained herein reflects review and annotations entered in association with encounter. Interpretation of such information and data should be left to medically-trained personnel. Information provided to patient can be located elsewhere in the medical record under "Patient Instructions". Document created using STT-dictation technology, any transcriptional errors that may result from process are unintentional.    Contact & Pharmacy Preferred: [phone] Home: [phone] (home) Mobile: [phone] (mobile) E-mail: [email or web address]  [id number] [address], [address] [place] [address] Phone: [phone] Fax: [phone]   Pre-screening  Jessica Knight offered "in-person" vs "virtual" encounter. She indicated preferring virtual for this encounter.   Reason COVID-19*  Social distancing based on CDC and AMA recommendations.   I contacted Jessica Knight on 09/25/2021 via telephone.      I clearly identified myself as Gillis Santa, MD. I verified that I was speaking with the correct person using two identifiers (Name: Jessica Knight, and date of birth: [birth year]).  Consent I sought verbal advanced consent from Jessica Knight for virtual visit interactions. I informed Jessica Knight of possible security and privacy concerns, risks, and limitations associated with providing "not-in-person" medical evaluation and management services. I also informed Jessica Knight of the  availability of "in-person" appointments. Finally, I informed her that there would be a charge for the virtual visit and that she could be  personally, fully or partially, financially responsible for it. Jessica Knight expressed understanding and agreed to proceed.   Historic Elements   Jessica Knight is a 40 y.o. year old, female patient evaluated today after our last contact on 09/03/2021. Jessica Knight  has a past medical history of Asthma, Diabetes mellitus without complication ([place]), and Thyroid disease. She also  has a past surgical history that includes Breast surgery; LASIK; and Hernia repair. Jessica Knight has a current medication list which includes the following prescription(s): albuterol sulfate, amoxicillin, budesonide-formoterol, carboxymethylcellulose, cetirizine, estarylla, fluticasone, ibuprofen, levothyroxine, lidocaine, loratadine, meloxicam, montelukast, multivitamin, norgestimate-ethinyl estradiol, and medroxyprogesterone. She  reports that she has never smoked. She has never used smokeless tobacco. She reports that she does not currently use alcohol. She reports that she does not currently use drugs. Jessica Knight has No Known Allergies.   HPI  Today, she is being contacted for  review cervical and lumbar MRI results    Laboratory Chemistry Profile   Renal Lab Results  Component Value Date   BUN 12 05/21/2020   CREATININE 1.00 05/21/2020   LABCREA 33 05/18/2020   GFRAA >60 05/21/2020   GFRNONAA >60 05/21/2020    Hepatic Lab Results  Component Value Date   AST 37 05/21/2020   ALT 16 05/21/2020   ALBUMIN 2.3 (L) 05/21/2020   ALKPHOS 146 (H) 05/21/2020    Electrolytes Lab Results  Component Value Date   NA 136 05/21/2020   K 3.8 05/21/2020   CL 109 05/21/2020   CALCIUM 7.4 (L) 05/21/2020   MG 5.8 (H) 05/20/2020    Bone No results found for: [place], [id number], GQ9169IH0, [id number], 25OHVITD1, 25OHVITD2, 25OHVITD3, TESTOFREE, TESTOSTERONE  Inflammation (CRP: Acute  Phase) (ESR: Chronic Phase) No results found for: CRP, ESRSEDRATE, LATICACIDVEN       Note: Above Lab results reviewed.  Imaging  MR LUMBAR SPINE WO CONTRAST CLINICAL DATA:  Lumbosacral osteoarthritis with lumbar radiculopathy  EXAM: MRI LUMBAR SPINE WITHOUT CONTRAST  TECHNIQUE: Multiplanar, multisequence MR imaging of the lumbar spine was performed. No intravenous contrast was administered.  COMPARISON:  None.  FINDINGS: Segmentation:  Standard.  Alignment:  Mild anterolisthesis at L5-S1  Vertebrae:  No fracture, evidence of discitis, or bone lesion.  Conus medullaris and cauda equina: Conus extends to the L1 level. Conus and cauda equina appear normal.  Paraspinal and other soft tissues: Negative  Disc levels:  L3-4 mild disc desiccation and bulging.  L5 chronic pars defects with minor adjacent spurring. No herniation or impingement  IMPRESSION: 1. 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 2. Early disc degeneration at L3-4. 3. No neural compression or visible inflammation.  Electronically Signed   By: Jorje Guild M.D.   On: 09/19/2021 07:44 MR CERVICAL SPINE WO CONTRAST CLINICAL DATA:  Chronic neck pain. Numbness in the right arm and hand on and off for 5 years  EXAM: MRI CERVICAL SPINE WITHOUT CONTRAST  TECHNIQUE: Multiplanar, multisequence MR imaging of the cervical spine was performed. No intravenous contrast was administered.  COMPARISON:  None.  FINDINGS: Alignment: Straightening of cervical lordosis  Vertebrae: No fracture, evidence of discitis, or bone lesion.  Cord: Normal signal and morphology.  Posterior Fossa, vertebral arteries, paraspinal tissues: Negative.  Disc levels:  C2-3: Unremarkable.  C3-4: Mild right uncovertebral spurring.  C4-5: Unremarkable.  C5-6: Disc narrowing with endplate and uncovertebral ridging causing biforaminal impingement. Disc osteophyte complex, primarily disc indents the ventral  cord  C6-7: Disc bulging especially into the foramina with asymmetric right impingement  C7-T1:Asymmetric right facet spurring. Moderate right foraminal narrowing  IMPRESSION: 1. Degenerative foraminal impingement bilaterally at C5-6, on the right at C6-7, and to a lesser degree on the right at C7-T1. 2. Diffusely patent spinal canal.  Electronically Signed   By: Jorje Guild M.D.   On: 09/19/2021 07:42  Assessment  The primary encounter diagnosis was Cervical radicular pain (right C5/7). Diagnoses of Foraminal stenosis of cervical region, Lumbar radiculopathy (Right L5 EMG/NCV), Lumbar degenerative disc disease, Chronic pain syndrome, Cervicalgia, Other intervertebral disc degeneration, lumbar region, and Myofascial pain syndrome of lumbar spine were also pertinent to this visit.  Plan of Care    I had an extensive discussion with the patient reviewing her cervical and MRI results.  We will continue to monitor her C5 and C6 area in her cervical region.  She has had cervical epidural steroid injections in the past with limited response.  In regards to her lower lumbar spine, this is largely unremarkable.  Her pain is likely musculoskeletal.  Encouraged stretching, physical therapy and as needed Tylenol and ibuprofen.  She could consider upper extremity nerve conduction velocity and EMG study to evaluate for peripheral neuropathy.  She states that she has an upcoming appointment at the [facility] with a neurologist and she will discuss then.  Follow-up as needed.  Follow-up plan:   Return if symptoms worsen or fail to improve.     Right C7-T1 ESI, bilateral sacroiliac joint injection 09/19/2020, Right C7-T1 ESI #2 05/27/21, #3 07/08/21         Recent Visits Date Type Provider Dept  09/03/21 Office Visit Gillis Santa, MD [facility]  07/08/21 Procedure visit Gillis Santa, MD  [facility]  07/01/21 Telemedicine Gillis Santa, MD [facility]  Showing recent visits  within past 90 days and meeting all other requirements Today's Visits Date Type Provider Dept  09/25/21 Office Visit Gillis Santa, MD [facility]  Showing today's visits and meeting all other requirements Future Appointments No visits were found meeting these conditions. Showing future appointments within next 90 days and meeting all other requirements I discussed the assessment and treatment plan with the patient. The patient was provided an opportunity to ask questions and all were answered. The patient agreed with the plan and demonstrated an understanding of the instructions.  Patient advised to call back or seek an in-person evaluation if the symptoms or condition worsens.  Duration of encounter: 45mnutes.  Note by: BGillis Santa MD Date: 09/25/2021; Time: 3:35 PM

## 2021-12-01 NOTE — L&D Delivery Note (Signed)
Delivery Note  Jessica Knight is a G2P1001 at [redacted]w[redacted]d with an LMP of 11/02/21, consistent with [place] at [redacted]w[redacted]d.   First Stage: Labor onset: 1924 Induction: oxytocin and cervical balloon Analgesia /Anesthesia intrapartum: epidural AROM at 1928 GBS: negative IP Antibiotics: none  Second Stage: Complete dilation at 2256 Onset of pushing at 2315 FHR second stage 155 bpm with moderate, variable decels with pushing overall reassuring  Jessica Knight presented to L&D for IOL for GDM A2. She was 2.5/50/-2. She progressed  to C/C/+2 with a spontaneous urge to push.  She pushed  effectively over approximately 49 minutes for a spontaneous vaginal birth.  Delivery of a viable baby girl on 07/29/22 at 0004 by CNM Delivery of fetal head in OA position with restitution to ROA. no nuchal cord;  1 minute 18 second should dystocia resolved by McRoberts and suprapubic pressure prior to delivery of Anterior then posterior shoulders delivered with gentle downward traction. Baby placed on mom's chest, and attended to by baby RN Cord double clamped after 30 seconds cut by CNM  Cord blood sample collection: Not Indicated A POS Collection of cord blood donation n/a Arterial cord blood sample n/a  Third Stage: Oxytocin bolus started after delivery of infant for hemorrhage prophylaxis  Placenta delivered schultz intact with 3 VC @ 0007 Placenta disposition: discarded per protocol Uterine tone firm / bleeding small  no laceration identified  Anesthesia for repair: n/a Repair none Est. Blood Loss (mL): 100  Complications: 1 minue 18 sec shoulder dystocia  Mom to postpartum.  Baby to Couplet care / Skin to Skin.  Newborn: Information for the patient's newborn:  Headlee, Girl Darrian [[id number]]  Live born female "Kara Mead Birth Weight:   APGAR: ,   Newborn Delivery   Birth date/time: 07/29/2022 00:04:00 Delivery type: Vaginal, Spontaneous       Feeding planned: breast feeding  ---------- Chari Manning,  CNM Certified Nurse Midwife [facility]

## 2022-01-06 DIAGNOSIS — O0993 Supervision of high risk pregnancy, unspecified, third trimester: Secondary | ICD-10-CM | POA: Insufficient documentation

## 2022-01-11 IMAGING — MR MR CERVICAL SPINE W/O CM
5 series · 40 of 48 positions shown · non-contrast
Comparison: None.

CLINICAL DATA: Chronic neck pain. Numbness in the right arm and
hand on and off for 5 years

EXAM:
MRI CERVICAL SPINE WITHOUT CONTRAST
TECHNIQUE: Multiplanar, multisequence MR imaging of the cervical spine was
performed. No intravenous contrast was administered.

[Series 9: T2 · sagittal · 3.0mm · 0.62mm/px · 6 of 15 slices shown (1 of 2)]
[im 1/15]
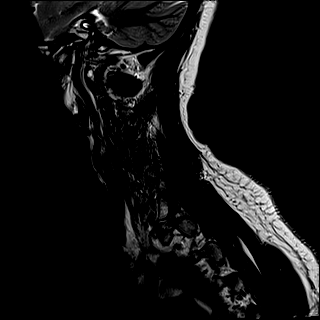
[im 3/15]
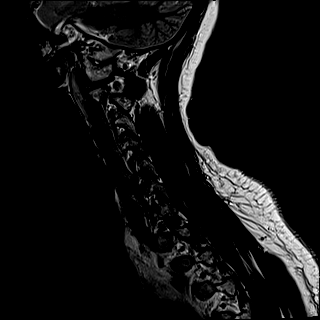
[im 6/15]
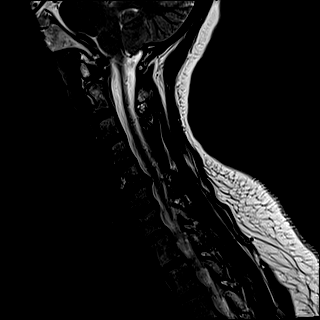
[im 9/15]
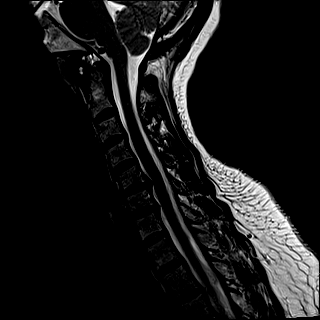
[im 12/15]
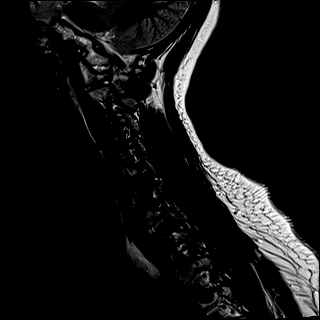
[im 15/15]
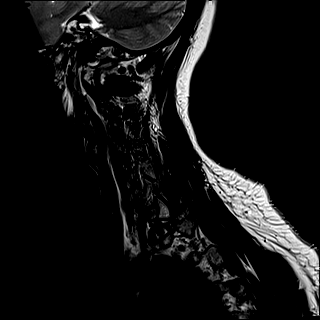

[Series 10: FLAIR · sagittal · 3.0mm · 0.78mm/px · 7 of 15 slices shown]
[im 1/15]
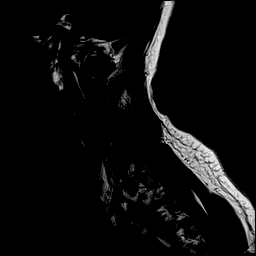
[im 3/15]
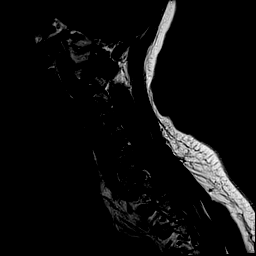
[im 5/15]
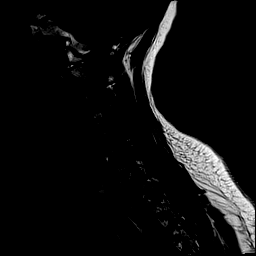
[im 8/15]
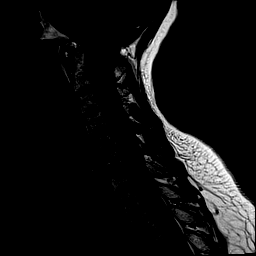
[im 10/15]
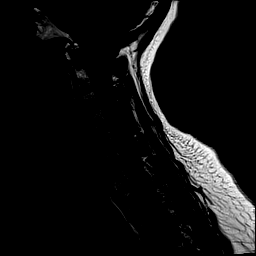
[im 12/15]
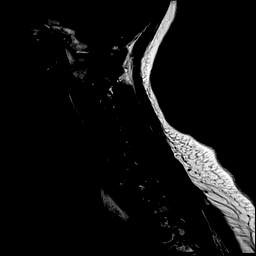
[im 15/15]
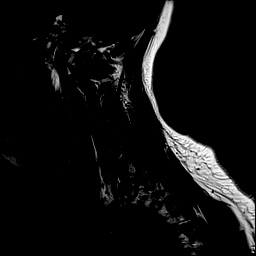

[Series 11: STIR · sagittal · 3.0mm · 0.62mm/px · 7 of 15 slices shown]
[im 1/15]
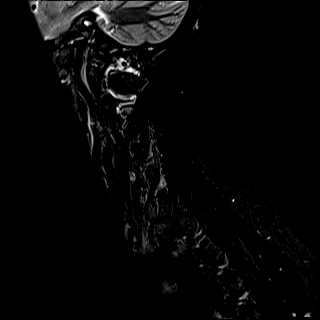
[im 3/15]
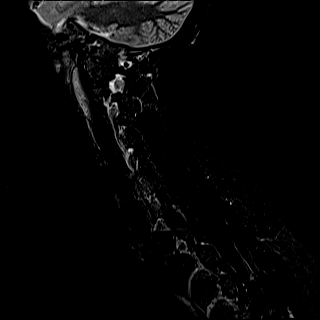
[im 5/15]
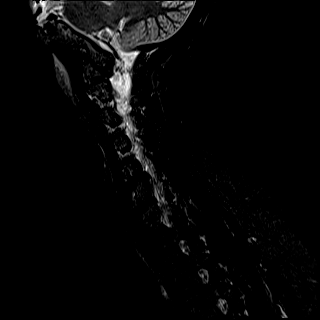
[im 8/15]
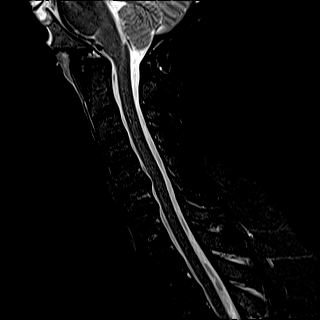
[im 10/15]
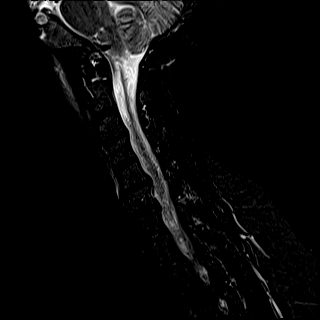
[im 12/15]
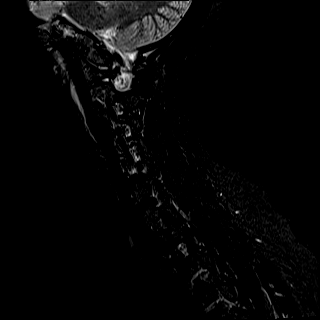
[im 15/15]
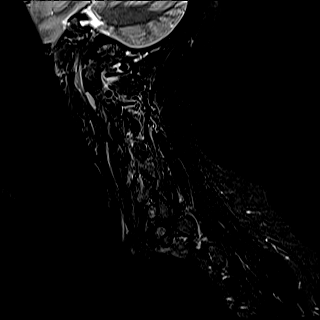

[Series 12: T2 · axial · 3.0mm · 0.70mm/px · z∈[+11,+105]mm · 12 of 29 slices shown (2 of 2)]
[im 1/29]
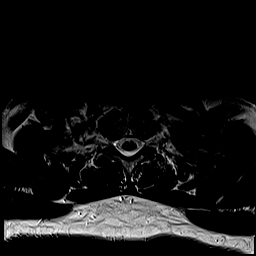
[im 3/29]
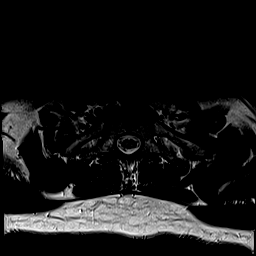
[im 5/29]
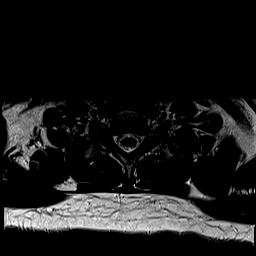
[im 7/29]
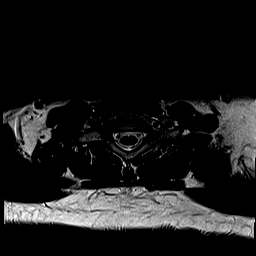
[im 9/29]
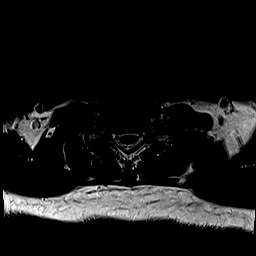
[im 11/29]
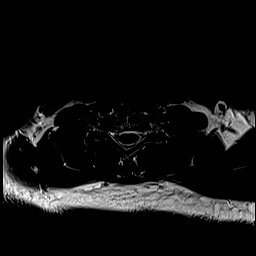
[im 13/29]
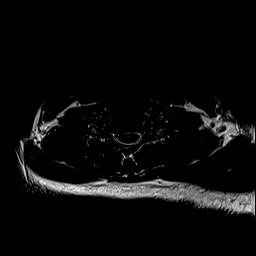
[im 16/29]
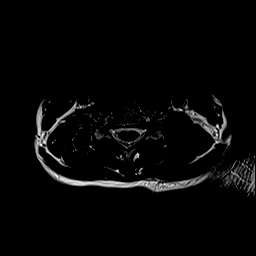
[im 18/29]
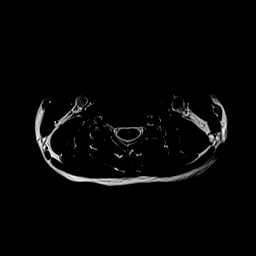
[im 20/29]
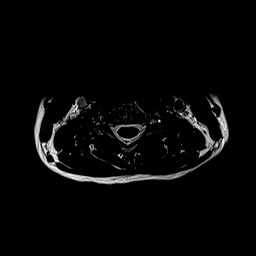
[im 24/29]
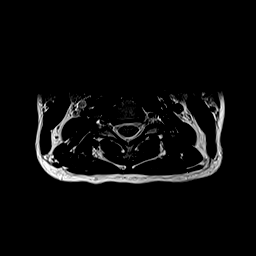
[im 29/29]
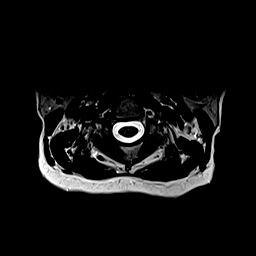

[Series 13: ax mpgr · axial · 3.0mm · 0.35mm/px · z∈[+11,+105]mm · 8 of 29 slices shown]
[im 1/29]
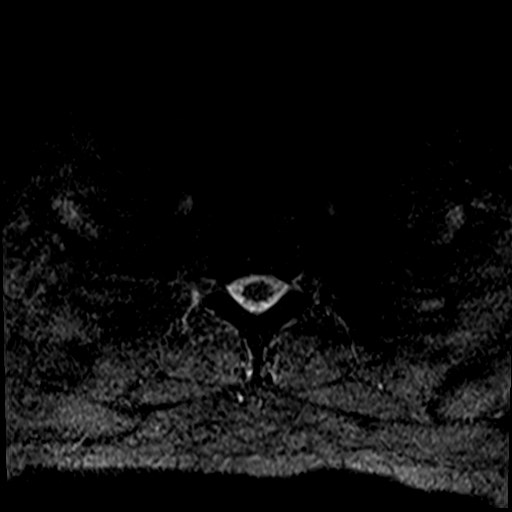
[im 5/29]
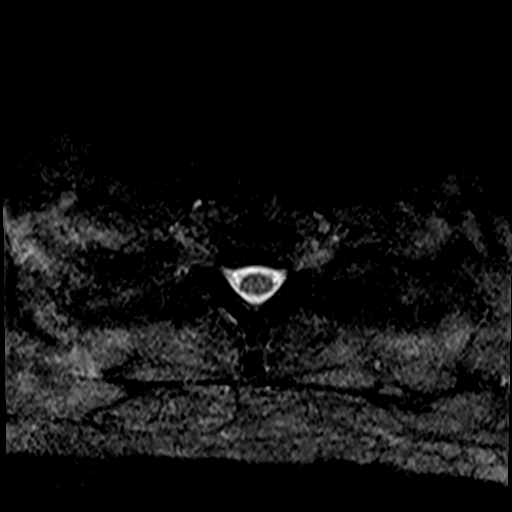
[im 9/29]
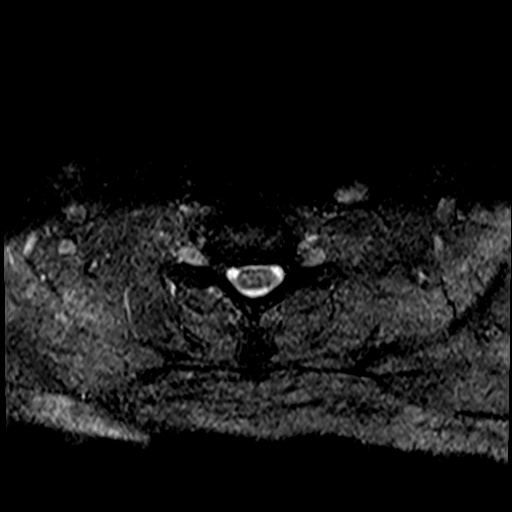
[im 13/29]
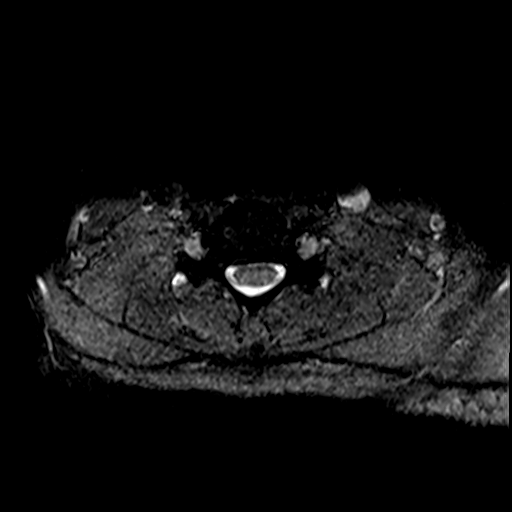
[im 16/29]
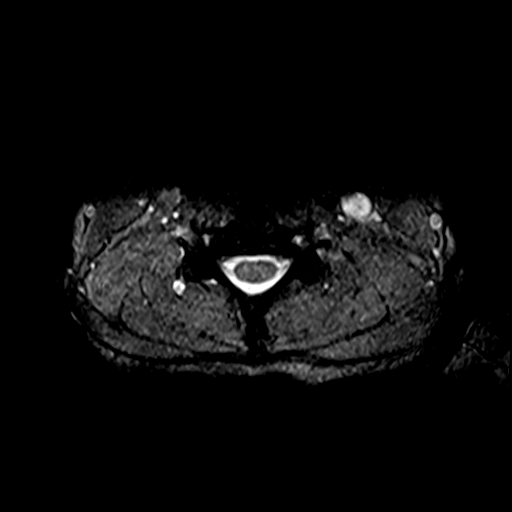
[im 20/29]
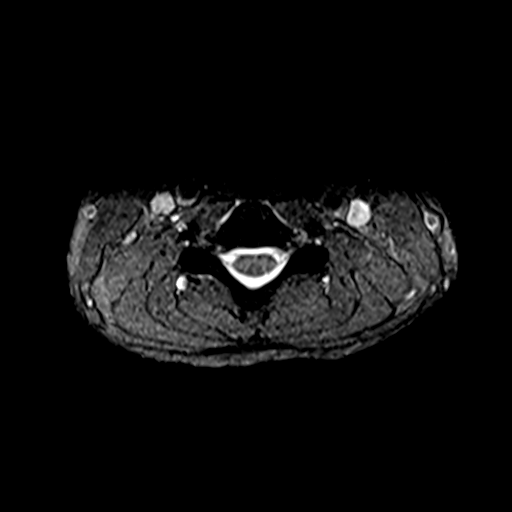
[im 24/29]
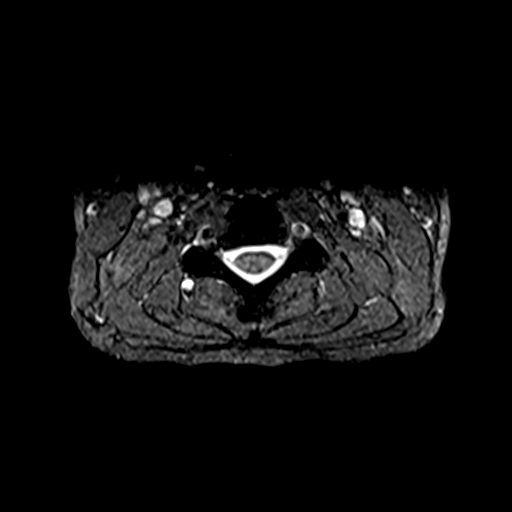
[im 29/29]
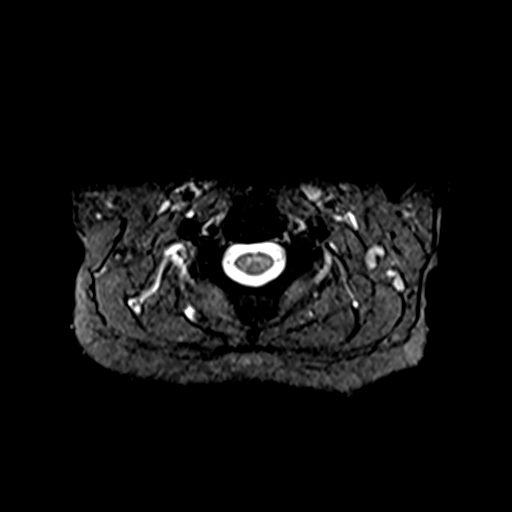

[40 of 48 positions shown; findings below may reference images not displayed]

FINDINGS: Alignment: Straightening of cervical lordosis

Vertebrae: No fracture, evidence of discitis, or bone lesion.

Cord: Normal signal and morphology.

Posterior Fossa, vertebral arteries, paraspinal tissues: Negative.

Disc levels:

C2-3: Unremarkable.

C3-4: Mild right uncovertebral spurring.

C4-5: Unremarkable.

C5-6: Disc narrowing with endplate and uncovertebral ridging causing
biforaminal impingement. Disc osteophyte complex, primarily disc
indents the ventral cord

C6-7: Disc bulging especially into the foramina with asymmetric
right impingement

C7-T1:Asymmetric right facet spurring. Moderate right foraminal
narrowing
IMPRESSION: 1. Degenerative foraminal impingement bilaterally at C5-6, on the
right at C6-7, and to a lesser degree on the right at C7-T1.
2. Diffusely patent spinal canal.

## 2022-01-13 LAB — OB RESULTS CONSOLE RPR: RPR: NONREACTIVE

## 2022-01-13 LAB — OB RESULTS CONSOLE GC/CHLAMYDIA
Chlamydia: NEGATIVE
Neisseria Gonorrhea: NEGATIVE

## 2022-01-13 LAB — OB RESULTS CONSOLE RUBELLA ANTIBODY, IGM: Rubella: IMMUNE

## 2022-01-13 LAB — OB RESULTS CONSOLE VARICELLA ZOSTER ANTIBODY, IGG: Varicella: IMMUNE

## 2022-01-13 LAB — OB RESULTS CONSOLE HEPATITIS B SURFACE ANTIGEN: Hepatitis B Surface Ag: NEGATIVE

## 2022-03-27 DIAGNOSIS — O99213 Obesity complicating pregnancy, third trimester: Secondary | ICD-10-CM | POA: Insufficient documentation

## 2022-06-12 DIAGNOSIS — O09523 Supervision of elderly multigravida, third trimester: Secondary | ICD-10-CM | POA: Insufficient documentation

## 2022-07-01 ENCOUNTER — Other Ambulatory Visit: Payer: Self-pay

## 2022-07-01 ENCOUNTER — Encounter: Payer: Self-pay | Admitting: Obstetrics and Gynecology

## 2022-07-01 ENCOUNTER — Observation Stay
Admission: EM | Admit: 2022-07-01 | Discharge: 2022-07-01 | Disposition: A | Discharge status: Home / Self Care | Payer: No Typology Code available for payment source | Attending: Obstetrics and Gynecology | Admitting: Obstetrics and Gynecology

## 2022-07-01 DIAGNOSIS — J45909 Unspecified asthma, uncomplicated: Secondary | ICD-10-CM | POA: Insufficient documentation

## 2022-07-01 DIAGNOSIS — O26893 Other specified pregnancy related conditions, third trimester: Secondary | ICD-10-CM | POA: Diagnosis present

## 2022-07-01 DIAGNOSIS — R1013 Epigastric pain: Secondary | ICD-10-CM | POA: Insufficient documentation

## 2022-07-01 DIAGNOSIS — O24415 Gestational diabetes mellitus in pregnancy, controlled by oral hypoglycemic drugs: Secondary | ICD-10-CM | POA: Diagnosis not present

## 2022-07-01 DIAGNOSIS — O0993 Supervision of high risk pregnancy, unspecified, third trimester: Principal | ICD-10-CM | POA: Insufficient documentation

## 2022-07-01 DIAGNOSIS — Z79899 Other long term (current) drug therapy: Secondary | ICD-10-CM | POA: Diagnosis not present

## 2022-07-01 DIAGNOSIS — O99283 Endocrine, nutritional and metabolic diseases complicating pregnancy, third trimester: Secondary | ICD-10-CM | POA: Insufficient documentation

## 2022-07-01 DIAGNOSIS — O09513 Supervision of elderly primigravida, third trimester: Secondary | ICD-10-CM | POA: Insufficient documentation

## 2022-07-01 DIAGNOSIS — E039 Hypothyroidism, unspecified: Secondary | ICD-10-CM | POA: Diagnosis not present

## 2022-07-01 DIAGNOSIS — R1011 Right upper quadrant pain: Secondary | ICD-10-CM | POA: Diagnosis present

## 2022-07-01 DIAGNOSIS — O99513 Diseases of the respiratory system complicating pregnancy, third trimester: Secondary | ICD-10-CM | POA: Insufficient documentation

## 2022-07-01 DIAGNOSIS — Z7984 Long term (current) use of oral hypoglycemic drugs: Secondary | ICD-10-CM | POA: Insufficient documentation

## 2022-07-01 DIAGNOSIS — Z3A34 34 weeks gestation of pregnancy: Secondary | ICD-10-CM | POA: Diagnosis not present

## 2022-07-01 LAB — COMPREHENSIVE METABOLIC PANEL
ALT: 17 U/L (ref 0–44)
AST: 20 U/L (ref 15–41)
Albumin: 3.1 g/dL — ABNORMAL LOW (ref 3.5–5.0)
Alkaline Phosphatase: 117 U/L (ref 38–126)
Anion gap: 8 (ref 5–15)
BUN: 5 mg/dL — ABNORMAL LOW (ref 6–20)
CO2: 22 mmol/L (ref 22–32)
Calcium: 9 mg/dL (ref 8.9–10.3)
Chloride: 105 mmol/L (ref 98–111)
Creatinine, Ser: 0.51 mg/dL (ref 0.44–1.00)
GFR, Estimated: >60 mL/min (ref >60)
Glucose, Bld: 97 mg/dL (ref 70–99)
Potassium: 3.6 mmol/L (ref 3.5–5.1)
Sodium: 135 mmol/L (ref 135–145)
Total Bilirubin: 0.5 mg/dL (ref 0.3–1.2)
Total Protein: 6.4 g/dL — ABNORMAL LOW (ref 6.5–8.1)

## 2022-07-01 LAB — URINALYSIS, COMPLETE (UACMP) WITH MICROSCOPIC
Bacteria, UA: NONE SEEN
Bilirubin Urine: NEGATIVE
Glucose, UA: NEGATIVE mg/dL
Hgb urine dipstick: NEGATIVE
Ketones, ur: 5 mg/dL — AB
Leukocytes,Ua: NEGATIVE
Nitrite: NEGATIVE
Protein, ur: 30 mg/dL — AB
Specific Gravity, Urine: 1.014 (ref 1.005–1.030)
pH: 7 (ref 5.0–8.0)

## 2022-07-01 LAB — CBC WITH DIFFERENTIAL/PLATELET
Abs Immature Granulocytes: 0.08 10*3/uL — ABNORMAL HIGH (ref 0.00–0.07)
Basophils Absolute: 0 10*3/uL (ref 0.0–0.1)
Basophils Relative: 0 %
Eosinophils Absolute: 0.2 10*3/uL (ref 0.0–0.5)
Eosinophils Relative: 2 %
HCT: 33.8 % — ABNORMAL LOW (ref 36.0–46.0)
Hemoglobin: 11.1 g/dL — ABNORMAL LOW (ref 12.0–15.0)
Immature Granulocytes: 1 %
Lymphocytes Relative: 24 %
Lymphs Abs: 1.8 10*3/uL (ref 0.7–4.0)
MCH: 29.3 pg (ref 26.0–34.0)
MCHC: 32.8 g/dL (ref 30.0–36.0)
MCV: 89.2 fL (ref 80.0–100.0)
Monocytes Absolute: 0.7 10*3/uL (ref 0.1–1.0)
Monocytes Relative: 10 %
Neutro Abs: 4.5 10*3/uL (ref 1.7–7.7)
Neutrophils Relative %: 63 %
Platelets: 225 10*3/uL (ref 150–400)
RBC: 3.79 MIL/uL — ABNORMAL LOW (ref 3.87–5.11)
RDW: 12.9 % (ref 11.5–15.5)
WBC: 7.3 10*3/uL (ref 4.0–10.5)
nRBC: 0 % (ref 0.0–0.2)

## 2022-07-01 LAB — PROTEIN / CREATININE RATIO, URINE
Creatinine, Urine: 112 mg/dL
Protein Creatinine Ratio: 0.13 mg/mg{Cre} (ref 0.00–0.15)
Total Protein, Urine: 15 mg/dL

## 2022-07-01 LAB — LIPASE, BLOOD: Lipase: 35 U/L (ref 11–51)

## 2022-07-01 LAB — AMYLASE: Amylase: 80 U/L (ref 28–100)

## 2022-07-01 MED ORDER — PANTOPRAZOLE SODIUM 40 MG PO TBEC: 40.0000 mg | DELAYED_RELEASE_TABLET | Freq: Every day | ORAL | 0 refills | Status: DC

## 2022-07-01 MED ORDER — PANTOPRAZOLE SODIUM 40 MG PO TBEC
40.0000 mg | DELAYED_RELEASE_TABLET | Freq: Every day | ORAL | Status: DC
  Administered 2022-07-01: 40 mg via ORAL
  Filled 2022-07-01: qty 1

## 2022-07-01 MED ORDER — FAMOTIDINE 20 MG PO TABS
ORAL_TABLET | ORAL | Status: AC
  Filled 2022-07-01: qty 1

## 2022-07-01 MED ORDER — FAMOTIDINE 20 MG PO TABS: 20.0000 mg | ORAL_TABLET | Freq: Two times a day (BID) | ORAL | Status: DC

## 2022-07-01 MED ORDER — FAMOTIDINE 20 MG PO TABS: 20.0000 mg | ORAL_TABLET | Freq: Two times a day (BID) | ORAL | 0 refills | Status: DC

## 2022-07-01 NOTE — Discharge Summary (Signed)
Jessica Knight is a 41 y.o. female. She is at [redacted]w[redacted]d gestation. Patient's last menstrual period was 11/02/2021 (approximate). Estimated Date of Delivery: 08/09/22  [facility] *** [facility] *** [facility]  Current pregnancy complicated by: ***  Chief complaint:  Location: Onset/timing: Duration: Quality:  Severity: Aggravating or alleviating conditions: Associated signs/symptoms: Context:  S: Resting comfortably. no CTX, no VB.no LOF,  Active fetal movement. *** ***Denies: HA, visual changes, SOB, or RUQ/epigastric pain  Maternal Medical History:   Past Medical History:  Diagnosis Date   Asthma    Diabetes mellitus without complication (HCC)    Thyroid disease     Past Surgical History:  Procedure Laterality Date   BREAST SURGERY     HERNIA REPAIR     LASIK      No Known Allergies  Prior to Admission medications   Medication Sig Start Date End Date Taking? Authorizing Provider  Albuterol Sulfate 108 (90 Base) MCG/ACT AEPB Inhale 2 puffs into the lungs every 6 (six) hours as needed. 05/23/19  Yes [provider]  budesonide-formoterol (SYMBICORT) 80-4.5 MCG/ACT inhaler INHALE 2 PUFFS BY MOUTH DAILY (RINSE MOUTH WELL WITH WATER AFTER EACH USE) ASTHMA CONTROLLER MEDICATION 06/19/20  Yes [provider]  carboxymethylcellulose (REFRESH PLUS) 0.5 % SOLN Apply 1 drop to eye as needed. 12/27/20  Yes [provider]  cetirizine (ZYRTEC) 10 MG tablet Take 10 mg by mouth daily.  08/12/19  Yes [provider]  fluticasone (FLONASE) 50 MCG/ACT nasal spray Place 1 spray into both nostrils daily. 02/15/19  Yes [provider]  levothyroxine (SYNTHROID) 125 MCG tablet Take 125 mcg by mouth daily before breakfast.   Yes [provider]  loratadine (CLARITIN) 10 MG tablet Take 1 tablet by mouth daily. 03/15/21  Yes [provider]  Multiple Vitamin (MULTIVITAMIN) tablet Take 1 tablet by  mouth daily.   Yes [provider]  amoxicillin (AMOXIL) 875 MG tablet Take 875 mg by mouth in the morning and at bedtime. Patient not taking: Reported on 07/01/2022 01/31/21   [provider]  ESTARYLLA 0.25-35 MG-MCG tablet Take 1 tablet by mouth daily. Patient not taking: Reported on 07/01/2022 09/03/21   [provider]  famotidine (PEPCID) 20 MG tablet Take 1 tablet (20 mg total) by mouth 2 (two) times daily. 07/01/22   Kahlen Morais, Prudencio Pair, CNM  ibuprofen (ADVIL) 600 MG tablet Take 1 tablet (600 mg total) by mouth every 6 (six) hours as needed for mild pain, moderate pain or cramping. Patient taking differently: Take 800 mg by mouth every 6 (six) hours as needed for mild pain, moderate pain or cramping. 05/22/20   Genia Del, CNM  lidocaine (LIDODERM) 5 % APPLY 1 PATCH TO SKIN ONCE DAILY (APPLY FOR 12 HOURS, THEN REMOVE FOR 12 HOURS) CUT IN HALF AND APPLY TO NECK AND LOWER BACK Patient not taking: Reported on 07/01/2022 06/19/20   [provider]  medroxyPROGESTERone (PROVERA) 10 MG tablet Take 10 mg by mouth daily. 10 days. 08/26/21 09/05/21  [provider]  meloxicam (MOBIC) 15 MG tablet Take 1 tablet by mouth daily. Patient not taking: Reported on 07/01/2022 04/05/21   [provider]  montelukast (SINGULAIR) 10 MG tablet Take 10 mg by mouth daily. Patient not taking: Reported on 07/01/2022 03/15/21   [provider]  norgestimate-ethinyl estradiol (ORTHO-CYCLEN) 0.25-35 MG-MCG tablet Take 1 tablet by mouth daily. Patient not taking: Reported on 07/01/2022 09/03/21 09/03/22  [provider]  pantoprazole (PROTONIX) 40 MG tablet Take 1 tablet (40 mg total) by mouth daily. 07/02/22   Rickesha Veracruz, Prudencio Pair, CNM      Social History: She  reports that she has never smoked. She has never used smokeless tobacco. She reports that she does not currently use alcohol. She reports that she does not currently use drugs.  Family History: family history  includes Breast cancer in her maternal aunt; Diabetes in her maternal grandmother. *** no history of gyn cancers  Review of Systems: A full review of systems was performed and negative except as noted in the HPI.     O:  BP 117/76 (BP Location: Left Arm)   Pulse 73   Temp (!) 97.5 F (36.4 C) (Oral)   Resp 20   Ht 5\' 4"  (1.626 m)   Wt 90.7 kg   LMP 11/02/2021 (Approximate)   SpO2 100%   BMI 34.33 kg/m  Results for orders placed or performed during the hospital encounter of 07/01/22 (from the past 48 hour(s))  Urinalysis, Complete w Microscopic Urine, Clean Catch   Collection Time: 07/01/22  3:47 PM  Result Value Ref Range   Color, Urine YELLOW (A) YELLOW   APPearance CLEAR (A) CLEAR   Specific Gravity, Urine 1.014 1.005 - 1.030   pH 7.0 5.0 - 8.0   Glucose, UA NEGATIVE NEGATIVE mg/dL   Hgb urine dipstick NEGATIVE NEGATIVE   Bilirubin Urine NEGATIVE NEGATIVE   Ketones, ur 5 (A) NEGATIVE mg/dL   Protein, ur 30 (A) NEGATIVE mg/dL   Nitrite NEGATIVE NEGATIVE   Leukocytes,Ua NEGATIVE NEGATIVE   WBC, UA 0-5 0 - 5 WBC/hpf   Bacteria, UA NONE SEEN NONE SEEN   Squamous Epithelial / LPF 0-5 0 - 5   Mucus PRESENT   Protein / creatinine ratio, urine   Collection Time: 07/01/22  3:47 PM  Result Value Ref Range   Creatinine, Urine 112 mg/dL   Total Protein, Urine 15 mg/dL   Protein Creatinine Ratio 0.13 0.00 - 0.15 mg/mg[Cre]  Comprehensive metabolic panel   Collection Time: 07/01/22  4:49 PM  Result Value Ref Range   Sodium 135 135 - 145 mmol/L   Potassium 3.6 3.5 - 5.1 mmol/L   Chloride 105 98 - 111 mmol/L   CO2 22 22 - 32 mmol/L   Glucose, Bld 97 70 - 99 mg/dL   BUN 5 (L) 6 - 20 mg/dL   Creatinine, Ser 08/31/22 0.44 - 1.00 mg/dL   Calcium 9.0 8.9 - 9.38 mg/dL   Total Protein 6.4 (L) 6.5 - 8.1 g/dL   Albumin 3.1 (L) 3.5 - 5.0 g/dL   AST 20 15 - 41 U/L   ALT 17 0 - 44 U/L   Alkaline Phosphatase 117 38 - 126 U/L   Total Bilirubin 0.5 0.3 - 1.2 mg/dL   GFR, Estimated 10.1 >75  mL/min   Anion gap 8 5 - 15  CBC with Differential/Platelet   Collection Time: 07/01/22  4:49 PM  Result Value Ref Range   WBC 7.3 4.0 - 10.5 K/uL   RBC 3.79 (L) 3.87 - 5.11 MIL/uL   Hemoglobin 11.1 (L) 12.0 - 15.0 g/dL   HCT 08/31/22 (L) 25.8 - 52.7 %   MCV 89.2 80.0 - 100.0 fL   MCH 29.3 26.0 - 34.0 pg   MCHC 32.8 30.0 - 36.0 g/dL   RDW 78.2 42.3 - 53.6 %   Platelets 225 150 - 400 K/uL   nRBC 0.0 0.0 - 0.2 %  Neutrophils Relative % 63 %   Neutro Abs 4.5 1.7 - 7.7 K/uL   Lymphocytes Relative 24 %   Lymphs Abs 1.8 0.7 - 4.0 K/uL   Monocytes Relative 10 %   Monocytes Absolute 0.7 0.1 - 1.0 K/uL   Eosinophils Relative 2 %   Eosinophils Absolute 0.2 0.0 - 0.5 K/uL   Basophils Relative 0 %   Basophils Absolute 0.0 0.0 - 0.1 K/uL   Immature Granulocytes 1 %   Abs Immature Granulocytes 0.08 (H) 0.00 - 0.07 K/uL  Amylase   Collection Time: 07/01/22  4:49 PM  Result Value Ref Range   Amylase 80 28 - 100 U/L  Lipase, blood   Collection Time: 07/01/22  4:49 PM  Result Value Ref Range   Lipase 35 11 - 51 U/L     Constitutional: NAD, AAOx3  HE/ENT: extraocular movements grossly intact, moist mucous membranes CV: RRR PULM: nl respiratory effort, CTABL     Abd: gravid, non-tender, non-distended, soft      Ext: Non-tender, Nonedematous   Psych: mood appropriate, speech normal Pelvic: ***deferred/***SSE done  Pelvic exam: {pelvic exam:315900::"normal external genitalia, vulva, vagina, cervix, uterus and adnexa"}.  Fetal  monitoring: Cat *** Appropriate for GA Baseline:  Variability: moderate Accelerations: absent/ present x >2 Decelerations absent Time    A/P: 41 y.o. [redacted]w[redacted]d here for antenatal surveillance for ***  Principle Diagnosis:  ***High risk pregnancy in third trimester  Labor: not present.  Fetal Wellbeing: Reassuring Cat 1 tracing. Reactive NST  D/c home stable, precautions reviewed, follow-up as scheduled.    Prudencio Pair Dula Havlik, CNM @TODAY @ 6:53 PM

## 2022-07-01 NOTE — OB Triage Note (Addendum)
Pt presents c/o epigastric pain and abdominal cramping. Pt states the pain came on about 20 mins ago and made it hard for her to move. Pt reports having a hasbrown with a half glass of carrot beet juice and reports having broccoli, carrot, sqaush, cauliflower pasta bowl (lean cuisean) for lunch.Pt also reports having increased clear vaginal discharge today as well. Pt denies bleeding or LOF. Pt reports positive fetal movement. Pt denies recent intercourse. VSS. Will continue to monitor.

## 2022-07-11 ENCOUNTER — Other Ambulatory Visit: Payer: Self-pay | Admitting: Certified Nurse Midwife

## 2022-07-11 DIAGNOSIS — O24419 Gestational diabetes mellitus in pregnancy, unspecified control: Secondary | ICD-10-CM

## 2022-07-11 DIAGNOSIS — Z3490 Encounter for supervision of normal pregnancy, unspecified, unspecified trimester: Secondary | ICD-10-CM

## 2022-07-11 LAB — OB RESULTS CONSOLE GBS: GBS: NEGATIVE

## 2022-07-11 NOTE — Progress Notes (Signed)
Jessica Knight at [redacted]w[redacted]d, pregnant by embryo transfer  Scheduled for induction of labor for GDM, uncontrolled on 07/28/2022.   Prenatal provider: [facility] Pregnancy complicated by: Embryo transfer with REI GDM, taking Metformin and Glyburide Advanced maternal age Hypothyroid Obesity  Prenatal Labs: Blood type/Rh A pos  Antibody screen neg  Rubella Immune  Varicella Immune  RPR NR  HBsAg Neg  HIV NR  GC neg  Chlamydia neg  Genetic screening cfDNA negative, AFP negative  1 hour GTT 187  3 hour GTT 94, 202, 162, 104  GBS pending   Tdap: 05/22/2022 Flu: give in season Contraception: none Feeding preference: breastfeeding  ____ [name], CNM  Certified Nurse Midwife [place]  [facility]

## 2022-07-18 ENCOUNTER — Observation Stay
Admission: EM | Admit: 2022-07-18 | Discharge: 2022-07-18 | Disposition: A | Discharge status: Home / Self Care | Payer: No Typology Code available for payment source | Attending: Obstetrics and Gynecology | Admitting: Obstetrics and Gynecology

## 2022-07-18 DIAGNOSIS — E039 Hypothyroidism, unspecified: Secondary | ICD-10-CM | POA: Diagnosis not present

## 2022-07-18 DIAGNOSIS — O0993 Supervision of high risk pregnancy, unspecified, third trimester: Principal | ICD-10-CM | POA: Insufficient documentation

## 2022-07-18 DIAGNOSIS — O99283 Endocrine, nutritional and metabolic diseases complicating pregnancy, third trimester: Secondary | ICD-10-CM | POA: Diagnosis not present

## 2022-07-18 DIAGNOSIS — O4703 False labor before 37 completed weeks of gestation, third trimester: Secondary | ICD-10-CM | POA: Diagnosis present

## 2022-07-18 DIAGNOSIS — J45909 Unspecified asthma, uncomplicated: Secondary | ICD-10-CM | POA: Diagnosis not present

## 2022-07-18 DIAGNOSIS — O24435 Gestational diabetes mellitus in puerperium, controlled by oral hypoglycemic drugs: Secondary | ICD-10-CM | POA: Insufficient documentation

## 2022-07-18 DIAGNOSIS — O99513 Diseases of the respiratory system complicating pregnancy, third trimester: Secondary | ICD-10-CM | POA: Diagnosis not present

## 2022-07-18 DIAGNOSIS — Z3A36 36 weeks gestation of pregnancy: Secondary | ICD-10-CM | POA: Diagnosis not present

## 2022-07-18 DIAGNOSIS — Z7984 Long term (current) use of oral hypoglycemic drugs: Secondary | ICD-10-CM | POA: Insufficient documentation

## 2022-07-18 DIAGNOSIS — Z79899 Other long term (current) drug therapy: Secondary | ICD-10-CM | POA: Diagnosis not present

## 2022-07-18 DIAGNOSIS — O479 False labor, unspecified: Secondary | ICD-10-CM | POA: Diagnosis present

## 2022-07-18 LAB — URINALYSIS, ROUTINE W REFLEX MICROSCOPIC
Bilirubin Urine: NEGATIVE
Glucose, UA: NEGATIVE mg/dL
Hgb urine dipstick: NEGATIVE
Ketones, ur: NEGATIVE mg/dL
Nitrite: NEGATIVE
Protein, ur: NEGATIVE mg/dL
Specific Gravity, Urine: 1.004 — ABNORMAL LOW (ref 1.005–1.030)
pH: 7 (ref 5.0–8.0)

## 2022-07-18 MED ORDER — ACETAMINOPHEN 325 MG PO TABS: 650.0000 mg | ORAL_TABLET | ORAL | Status: DC | PRN

## 2022-07-18 MED ORDER — CALCIUM CARBONATE ANTACID 500 MG PO CHEW: 2.0000 | CHEWABLE_TABLET | ORAL | Status: DC | PRN

## 2022-07-18 MED ORDER — FENTANYL CITRATE (PF) 100 MCG/2ML IJ SOLN
INTRAMUSCULAR | Status: AC
  Administered 2022-07-18: 100 ug
  Filled 2022-07-18: qty 2

## 2022-07-18 MED ORDER — LACTATED RINGERS IV BOLUS
1000.0000 mL | Freq: Once | INTRAVENOUS | Status: AC
  Administered 2022-07-18: 1000 mL via INTRAVENOUS

## 2022-07-18 MED ORDER — FENTANYL CITRATE (PF) 100 MCG/2ML IJ SOLN: 100.0000 ug | Freq: Once | INTRAMUSCULAR | Status: DC

## 2022-07-18 MED ORDER — PRENATAL MULTIVITAMIN CH: 1.0000 | ORAL_TABLET | Freq: Every day | ORAL | Status: DC

## 2022-07-18 NOTE — OB Triage Note (Signed)
Pt presents from the office with c/o belly tightness and pelvic pressures on and off for the last couple of days. No leaking fluid or vaginal bleeding. Patient reports that she is staying well hydrated.

## 2022-07-18 NOTE — Discharge Summary (Signed)
Jessica Knight is a 41 y.o. female. She is at [redacted]w[redacted]d gestation. Patient's last menstrual period was 11/02/2021 (approximate). Estimated Date of Delivery: 08/09/22  Prenatal care site: [facility]  Current pregnancy complicated by:  Embryo transfer with REI GDM, taking Metformin and Glyburide Advanced maternal age Hypothyroid Obesity  Chief complaint: uterine contractions  She was sent over from the office with painful uterine contractions that were causing her to stop and moan and breathe through them, also having reports of rectal pressure. Contractions were every 3-4 minutes and she was 3/50/-1.  S: Resting comfortably. Regular CTX, no VB.no LOF,  Active fetal movement. Denies: HA, visual changes, SOB, or RUQ/epigastric pain  Maternal Medical History:   Past Medical History:  Diagnosis Date   Asthma    Diabetes mellitus without complication (HCC)    Thyroid disease     Past Surgical History:  Procedure Laterality Date   BREAST SURGERY     HERNIA REPAIR     LASIK      No Known Allergies  Prior to Admission medications   Medication Sig Start Date End Date Taking? Authorizing Provider  Albuterol Sulfate 108 (90 Base) MCG/ACT AEPB Inhale 2 puffs into the lungs every 6 (six) hours as needed. 05/23/19   [provider]  amoxicillin (AMOXIL) 875 MG tablet Take 875 mg by mouth in the morning and at bedtime. Patient not taking: Reported on 07/01/2022 01/31/21   [provider]  budesonide-formoterol (SYMBICORT) 80-4.5 MCG/ACT inhaler INHALE 2 PUFFS BY MOUTH DAILY (RINSE MOUTH WELL WITH WATER AFTER EACH USE) ASTHMA CONTROLLER MEDICATION 06/19/20   [provider]  carboxymethylcellulose (REFRESH PLUS) 0.5 % SOLN Apply 1 drop to eye as needed. 12/27/20   [provider]  cetirizine (ZYRTEC) 10 MG tablet Take 10 mg by mouth daily.  08/12/19   [provider]  ESTARYLLA 0.25-35 MG-MCG tablet Take 1 tablet by mouth daily. Patient not  taking: Reported on 07/01/2022 09/03/21   [provider]  famotidine (PEPCID) 20 MG tablet Take 1 tablet (20 mg total) by mouth 2 (two) times daily. 07/01/22   McVey, Prudencio Pair, CNM  fluticasone (FLONASE) 50 MCG/ACT nasal spray Place 1 spray into both nostrils daily. 02/15/19   [provider]  ibuprofen (ADVIL) 600 MG tablet Take 1 tablet (600 mg total) by mouth every 6 (six) hours as needed for mild pain, moderate pain or cramping. Patient taking differently: Take 800 mg by mouth every 6 (six) hours as needed for mild pain, moderate pain or cramping. 05/22/20   Genia Del, CNM  levothyroxine (SYNTHROID) 125 MCG tablet Take 125 mcg by mouth daily before breakfast.    [provider]  lidocaine (LIDODERM) 5 % APPLY 1 PATCH TO SKIN ONCE DAILY (APPLY FOR 12 HOURS, THEN REMOVE FOR 12 HOURS) CUT IN HALF AND APPLY TO NECK AND LOWER BACK Patient not taking: Reported on 07/01/2022 06/19/20   [provider]  loratadine (CLARITIN) 10 MG tablet Take 1 tablet by mouth daily. 03/15/21   [provider]  medroxyPROGESTERone (PROVERA) 10 MG tablet Take 10 mg by mouth daily. 10 days. 08/26/21 09/05/21  [provider]  meloxicam (MOBIC) 15 MG tablet Take 1 tablet by mouth daily. Patient not taking: Reported on 07/01/2022 04/05/21   [provider]  montelukast (SINGULAIR) 10 MG tablet Take 10 mg by mouth daily. Patient not taking: Reported on 07/01/2022 03/15/21   [provider]  Multiple Vitamin (MULTIVITAMIN) tablet Take 1 tablet by mouth daily.  [provider]  norgestimate-ethinyl estradiol (ORTHO-CYCLEN) 0.25-35 MG-MCG tablet Take 1 tablet by mouth daily. Patient not taking: Reported on 07/01/2022 09/03/21 09/03/22  [provider]  pantoprazole (PROTONIX) 40 MG tablet Take 1 tablet (40 mg total) by mouth daily. 07/02/22   McVey, Prudencio Pair, CNM      Social History: She  reports that she has never smoked. She has never used  smokeless tobacco. She reports that she does not currently use alcohol. She reports that she does not currently use drugs.  Family History: family history includes Breast cancer in her maternal aunt; Diabetes in her maternal grandmother.  no history of gyn cancers  Review of Systems: A full review of systems was performed and negative except as noted in the HPI.     O:  BP 129/82   Pulse 72   Resp 18   LMP 11/02/2021 (Approximate)  Results for orders placed or performed during the hospital encounter of 07/18/22 (from the past 48 hour(s))  Urinalysis, Routine w reflex microscopic Urine, Clean Catch   Collection Time: 07/18/22 11:48 AM  Result Value Ref Range   Color, Urine STRAW (A) YELLOW   APPearance HAZY (A) CLEAR   Specific Gravity, Urine 1.004 (L) 1.005 - 1.030   pH 7.0 5.0 - 8.0   Glucose, UA NEGATIVE NEGATIVE mg/dL   Hgb urine dipstick NEGATIVE NEGATIVE   Bilirubin Urine NEGATIVE NEGATIVE   Ketones, ur NEGATIVE NEGATIVE mg/dL   Protein, ur NEGATIVE NEGATIVE mg/dL   Nitrite NEGATIVE NEGATIVE   Leukocytes,Ua TRACE (A) NEGATIVE   RBC / HPF 0-5 0 - 5 RBC/hpf   WBC, UA 0-5 0 - 5 WBC/hpf   Bacteria, UA RARE (A) NONE SEEN   Squamous Epithelial / LPF 0-5 0 - 5     Constitutional: NAD, AAOx3  HE/ENT: extraocular movements grossly intact, moist mucous membranes CV: RRR PULM: nl respiratory effort, CTABL     Abd: gravid, non-tender, non-distended, soft      Ext: Non-tender, Nonedematous   Psych: mood appropriate, speech normal Pelvic: 3/50/-2  Pelvic exam: normal external genitalia, vulva, vagina, cervix, uterus and adnexa.  Fetal  monitoring: Cat 1 Appropriate for GA Baseline: 135bpm Variability: moderate Accelerations: present x >2 Decelerations absent  A/P: 41 y.o. [redacted]w[redacted]d here for antenatal surveillance for uterine contractions  Principle Diagnosis:  High risk pregnancy in third trimester, false labor  Labor: not present. No cervical change over 4 hours of  monitoring. She received 1L LR bolus and IV Fentanyl, which resolved her pain. She denies any pain and states she is feeling better. Encouraged increased hydration. Fetal Wellbeing: Reassuring Cat 1 tracing. Reactive NST  D/c home stable, precautions reviewed, follow-up as scheduled.    [name], CNM 07/18/2022 3:00 PM

## 2022-07-19 LAB — URINE CULTURE
Culture: <10000 — AB
Special Requests: NORMAL

## 2022-07-25 LAB — OB RESULTS CONSOLE HIV ANTIBODY (ROUTINE TESTING): HIV: NONREACTIVE

## 2022-07-28 ENCOUNTER — Other Ambulatory Visit: Payer: Self-pay

## 2022-07-28 ENCOUNTER — Encounter: Payer: Self-pay | Admitting: Obstetrics and Gynecology

## 2022-07-28 ENCOUNTER — Inpatient Hospital Stay: Payer: No Typology Code available for payment source | Admitting: General Practice

## 2022-07-28 ENCOUNTER — Inpatient Hospital Stay
Admission: EM | Admit: 2022-07-28 | Discharge: 2022-07-30 | DRG: 806 | Disposition: A | Discharge status: Home / Self Care | Payer: No Typology Code available for payment source | Attending: Obstetrics | Admitting: Obstetrics

## 2022-07-28 DIAGNOSIS — Z3A38 38 weeks gestation of pregnancy: Secondary | ICD-10-CM | POA: Diagnosis not present

## 2022-07-28 DIAGNOSIS — O9952 Diseases of the respiratory system complicating childbirth: Secondary | ICD-10-CM | POA: Diagnosis present

## 2022-07-28 DIAGNOSIS — Z1211 Encounter for screening for malignant neoplasm of colon: Secondary | ICD-10-CM | POA: Diagnosis present

## 2022-07-28 DIAGNOSIS — O9081 Anemia of the puerperium: Secondary | ICD-10-CM | POA: Diagnosis not present

## 2022-07-28 DIAGNOSIS — D62 Acute posthemorrhagic anemia: Secondary | ICD-10-CM | POA: Diagnosis not present

## 2022-07-28 DIAGNOSIS — J45909 Unspecified asthma, uncomplicated: Secondary | ICD-10-CM | POA: Diagnosis present

## 2022-07-28 DIAGNOSIS — O99284 Endocrine, nutritional and metabolic diseases complicating childbirth: Secondary | ICD-10-CM | POA: Diagnosis present

## 2022-07-28 DIAGNOSIS — O24419 Gestational diabetes mellitus in pregnancy, unspecified control: Secondary | ICD-10-CM

## 2022-07-28 DIAGNOSIS — E039 Hypothyroidism, unspecified: Secondary | ICD-10-CM | POA: Diagnosis present

## 2022-07-28 DIAGNOSIS — O99214 Obesity complicating childbirth: Secondary | ICD-10-CM | POA: Diagnosis present

## 2022-07-28 DIAGNOSIS — O24425 Gestational diabetes mellitus in childbirth, controlled by oral hypoglycemic drugs: Secondary | ICD-10-CM | POA: Diagnosis present

## 2022-07-28 DIAGNOSIS — Z3490 Encounter for supervision of normal pregnancy, unspecified, unspecified trimester: Principal | ICD-10-CM | POA: Diagnosis present

## 2022-07-28 LAB — PROTEIN / CREATININE RATIO, URINE
Creatinine, Urine: 38 mg/dL
Total Protein, Urine: <6 mg/dL

## 2022-07-28 LAB — TYPE AND SCREEN
ABO/RH(D): A POS
Antibody Screen: NEGATIVE

## 2022-07-28 LAB — COMPREHENSIVE METABOLIC PANEL
ALT: 21 U/L (ref 0–44)
AST: 27 U/L (ref 15–41)
Albumin: 3 g/dL — ABNORMAL LOW (ref 3.5–5.0)
Alkaline Phosphatase: 177 U/L — ABNORMAL HIGH (ref 38–126)
Anion gap: 13 (ref 5–15)
BUN: <5 mg/dL — ABNORMAL LOW (ref 6–20)
CO2: 17 mmol/L — ABNORMAL LOW (ref 22–32)
Calcium: 8.7 mg/dL — ABNORMAL LOW (ref 8.9–10.3)
Chloride: 106 mmol/L (ref 98–111)
Creatinine, Ser: 0.43 mg/dL — ABNORMAL LOW (ref 0.44–1.00)
GFR, Estimated: >60 mL/min (ref >60)
Glucose, Bld: 70 mg/dL (ref 70–99)
Potassium: 3.8 mmol/L (ref 3.5–5.1)
Sodium: 136 mmol/L (ref 135–145)
Total Bilirubin: 0.6 mg/dL (ref 0.3–1.2)
Total Protein: 6.7 g/dL (ref 6.5–8.1)

## 2022-07-28 LAB — CBC
HCT: 36.1 % (ref 36.0–46.0)
Hemoglobin: 11.7 g/dL — ABNORMAL LOW (ref 12.0–15.0)
MCH: 28.4 pg (ref 26.0–34.0)
MCHC: 32.4 g/dL (ref 30.0–36.0)
MCV: 87.6 fL (ref 80.0–100.0)
Platelets: 234 10*3/uL (ref 150–400)
RBC: 4.12 MIL/uL (ref 3.87–5.11)
RDW: 13.3 % (ref 11.5–15.5)
WBC: 7.3 10*3/uL (ref 4.0–10.5)
nRBC: 0 % (ref 0.0–0.2)

## 2022-07-28 LAB — GLUCOSE, CAPILLARY
Glucose-Capillary: 69 mg/dL — ABNORMAL LOW (ref 70–99)
Glucose-Capillary: 64 mg/dL — ABNORMAL LOW (ref 70–99)
Glucose-Capillary: 83 mg/dL (ref 70–99)
Glucose-Capillary: 70 mg/dL (ref 70–99)
Glucose-Capillary: 107 mg/dL — ABNORMAL HIGH (ref 70–99)

## 2022-07-28 MED ORDER — EPHEDRINE 5 MG/ML INJ
10.0000 mg | INTRAVENOUS | Status: AC | PRN
Start: 2022-07-28 — End: 2022-07-28
  Administered 2022-07-28 (×2): 10 mg via INTRAVENOUS

## 2022-07-28 MED ORDER — LACTATED RINGERS IV SOLN
500.0000 mL | INTRAVENOUS | Status: DC | PRN
  Administered 2022-07-28: 500 mL via INTRAVENOUS

## 2022-07-28 MED ORDER — LIDOCAINE HCL (PF) 1 % IJ SOLN
30.0000 mL | INTRAMUSCULAR | Status: DC | PRN
  Filled 2022-07-28: qty 30

## 2022-07-28 MED ORDER — BUPIVACAINE HCL (PF) 0.25 % IJ SOLN
INTRAMUSCULAR | Status: DC | PRN
  Administered 2022-07-28 (×2): 4 mL via EPIDURAL

## 2022-07-28 MED ORDER — MISOPROSTOL 25 MCG QUARTER TABLET: 25.0000 ug | ORAL_TABLET | Freq: Once | ORAL | Status: DC

## 2022-07-28 MED ORDER — OXYTOCIN-SODIUM CHLORIDE 30-0.9 UT/500ML-% IV SOLN
2.5000 [IU]/h | INTRAVENOUS | Status: DC
  Administered 2022-07-29: 2.5 [IU]/h via INTRAVENOUS
  Filled 2022-07-28: qty 500

## 2022-07-28 MED ORDER — TERBUTALINE SULFATE 1 MG/ML IJ SOLN: 0.2500 mg | Freq: Once | INTRAMUSCULAR | Status: DC | PRN

## 2022-07-28 MED ORDER — FENTANYL-BUPIVACAINE-NACL 0.5-0.125-0.9 MG/250ML-% EP SOLN
12.0000 mL/h | EPIDURAL | Status: DC | PRN
  Administered 2022-07-28: 12 mL/h via EPIDURAL
  Filled 2022-07-28: qty 250

## 2022-07-28 MED ORDER — ACETAMINOPHEN 325 MG PO TABS: 650.0000 mg | ORAL_TABLET | ORAL | Status: DC | PRN

## 2022-07-28 MED ORDER — SOD CITRATE-CITRIC ACID 500-334 MG/5ML PO SOLN: 30.0000 mL | ORAL | Status: DC | PRN

## 2022-07-28 MED ORDER — AMMONIA AROMATIC IN INHA
RESPIRATORY_TRACT | Status: AC
  Filled 2022-07-28: qty 10

## 2022-07-28 MED ORDER — OXYTOCIN-SODIUM CHLORIDE 30-0.9 UT/500ML-% IV SOLN
1.0000 m[IU]/min | INTRAVENOUS | Status: DC
  Administered 2022-07-28 (×2): 2 m[IU]/min via INTRAVENOUS
  Filled 2022-07-28: qty 500

## 2022-07-28 MED ORDER — FENTANYL CITRATE (PF) 100 MCG/2ML IJ SOLN: 50.0000 ug | INTRAMUSCULAR | Status: DC | PRN

## 2022-07-28 MED ORDER — PHENYLEPHRINE 80 MCG/ML (10ML) SYRINGE FOR IV PUSH (FOR BLOOD PRESSURE SUPPORT)
80.0000 ug | PREFILLED_SYRINGE | INTRAVENOUS | Status: DC | PRN
  Filled 2022-07-28: qty 10

## 2022-07-28 MED ORDER — ONDANSETRON HCL 4 MG/2ML IJ SOLN
4.0000 mg | Freq: Four times a day (QID) | INTRAMUSCULAR | Status: DC | PRN
  Administered 2022-07-28: 4 mg via INTRAVENOUS
  Filled 2022-07-28: qty 2

## 2022-07-28 MED ORDER — DIPHENHYDRAMINE HCL 50 MG/ML IJ SOLN: 12.5000 mg | INTRAMUSCULAR | Status: DC | PRN

## 2022-07-28 MED ORDER — MISOPROSTOL 200 MCG PO TABS
ORAL_TABLET | ORAL | Status: AC
  Filled 2022-07-28: qty 4

## 2022-07-28 MED ORDER — LACTATED RINGERS IV SOLN: INTRAVENOUS | Status: DC

## 2022-07-28 MED ORDER — EPHEDRINE 5 MG/ML INJ
10.0000 mg | INTRAVENOUS | Status: DC | PRN
Start: 2022-07-28 — End: 2022-07-29
  Filled 2022-07-28: qty 5

## 2022-07-28 MED ORDER — PHENYLEPHRINE 80 MCG/ML (10ML) SYRINGE FOR IV PUSH (FOR BLOOD PRESSURE SUPPORT)
80.0000 ug | PREFILLED_SYRINGE | INTRAVENOUS | Status: DC | PRN
Start: 2022-07-28 — End: 2022-07-29

## 2022-07-28 MED ORDER — LACTATED RINGERS IV SOLN
500.0000 mL | Freq: Once | INTRAVENOUS | Status: AC
Start: 2022-07-28 — End: 2022-07-28
  Administered 2022-07-28: 500 mL via INTRAVENOUS

## 2022-07-28 MED ORDER — OXYTOCIN BOLUS FROM INFUSION
333.0000 mL | Freq: Once | INTRAVENOUS | Status: AC
  Administered 2022-07-29: 333 mL via INTRAVENOUS

## 2022-07-28 MED ORDER — OXYTOCIN 10 UNIT/ML IJ SOLN
INTRAMUSCULAR | Status: DC
Start: 2022-07-28 — End: 2022-07-28
  Filled 2022-07-28: qty 2

## 2022-07-28 NOTE — Progress Notes (Signed)
Labor Check  Subj:  Complaints: feeling pain with contractions   Obj:  BP 119/78   Pulse 75   Temp (!) 97.5 F (36.4 C) (Oral)   Resp 18   Ht 5\' 4"  (1.626 m)   Wt 91.6 kg   LMP 11/02/2021 (Approximate)   BMI 34.67 kg/m  Dose (milli-units/min) Oxytocin: 14 milli-units/min  Cervix: Dilation: 2.5 /   /    Baseline FHR: 145 beats/min   Variability: moderate   Accelerations: present   Decelerations: present Contractions: present frequency: intermittent shallow variables, early Overall assessment: reassuring  Female chaperone present for pelvic exam:   A/P: 41 y.o. G2P1001 female at [redacted]w[redacted]d with GDM.  1.  Labor: progressing normally with cooks cath and pitocin. Patient now on 44mil/units of pitocin. Patient requesting for balloon to be removed 2.  FWB: Overall assessment: category 2  3.  GBS negative  4.  Pain: 7/10 5.  Recheck: cooks cath remains intact,   13m [facility] 07/28/2022 5:52 PM

## 2022-07-28 NOTE — Anesthesia Preprocedure Evaluation (Signed)
Anesthesia Evaluation  Patient identified by MRN, date of birth, ID band Patient awake    Reviewed: Allergy & Precautions, NPO status , Patient's Chart, lab work & pertinent test results  Airway Mallampati: III  TM Distance: >3 FB Neck ROM: full    Dental  (+) Dental Advidsory Given   Pulmonary asthma ,    Pulmonary exam normal        Cardiovascular Exercise Tolerance: Good (-) Past MI and (-) CABG negative cardio ROS Normal cardiovascular exam     Neuro/Psych    GI/Hepatic negative GI ROS,   Endo/Other  diabetes  Renal/GU   negative genitourinary   Musculoskeletal   Abdominal   Peds  Hematology negative hematology ROS (+)   Anesthesia Other Findings Past Medical History: No date: Asthma No date: Diabetes mellitus without complication (HCC) No date: Thyroid disease  Past Surgical History: No date: BREAST SURGERY No date: HERNIA REPAIR No date: LASIK  BMI    Body Mass Index: 34.67 kg/m      Reproductive/Obstetrics (+) Pregnancy                             Anesthesia Physical Anesthesia Plan  ASA: 2  Anesthesia Plan: Epidural   Post-op Pain Management:    Induction:   PONV Risk Score and Plan:   Airway Management Planned: Natural Airway  Additional Equipment:   Intra-op Plan:   Post-operative Plan:   Informed Consent: I have reviewed the patients History and Physical, chart, labs and discussed the procedure including the risks, benefits and alternatives for the proposed anesthesia with the patient or authorized representative who has indicated his/her understanding and acceptance.     Dental Advisory Given  Plan Discussed with: Anesthesiologist  Anesthesia Plan Comments: (Patient reports no bleeding problems and no anticoagulant use.   Patient consented for risks of anesthesia including but not limited to:  - adverse reactions to medications - risk of  bleeding, infection and or nerve damage from epidural that could lead to paralysis - risk of headache or failed epidural - nerve damage due to positioning - that if epidural is used for C-section that there is a chance of epidural failure requiring spinal placement or conversion to GA - Damage to heart, brain, lungs, other parts of body or loss of life  Patient voiced understanding.)        Anesthesia Quick Evaluation

## 2022-07-28 NOTE — H&P (Cosign Needed Addendum)
OB History & Physical   History of Present Illness:   Chief Complaint: IOL for GDM  HPI:  Jessica Knight is a 41 y.o. G2P1001 female at [redacted]w[redacted]d, Patient's last menstrual period was 11/02/2021 (approximate)., consistent with [place] at [redacted]w[redacted]d, with Estimated Date of Delivery: 08/09/22.  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   Lumbar degenerative disc disease 08/27/2020   Lumbar radiculopathy (Right L5 EMG/NCV) 08/27/2020   Chronic pain syndrome 08/27/2020   Cervical radicular pain (right C5/6/7) 08/27/2020   Bilateral carpal tunnel syndrome 08/27/2020   Foraminal stenosis of cervical region 08/27/2020   PPH (postpartum hemorrhage) 05/20/2020   Preeclampsia 05/20/2020   Gestational diabetes mellitus (GDM) requiring insulin 05/18/2020    Prenatal Transfer Tool  Maternal Diabetes: Yes:  Diabetes Type:  Insulin/Medication controlled Genetic Screening: Normal Maternal Ultrasounds/Referrals: Normal Fetal Ultrasounds or other Referrals:  Fetal echo Maternal Substance Abuse:  No Significant Maternal Medications:  Meds include: Syntroid Other: metformin and Glyburide Significant Maternal Lab Results: Group B Strep negative  Maternal Medical History:   Past Medical History:  Diagnosis Date   Asthma    Diabetes mellitus without complication (HCC)    Thyroid disease     Past Surgical History:  Procedure Laterality Date   BREAST SURGERY      HERNIA REPAIR     LASIK      No Known Allergies  Prior to Admission medications   Medication Sig Start Date End Date Taking? Authorizing Provider  Albuterol Sulfate 108 (90 Base) MCG/ACT AEPB Inhale 2 puffs into the lungs every 6 (six) hours as needed. 05/23/19  Yes [provider]  carboxymethylcellulose (REFRESH PLUS) 0.5 % SOLN Apply 1 drop to eye as needed. 12/27/20  Yes [provider]  famotidine (PEPCID) 20 MG tablet Take 1 tablet (20 mg total) by mouth 2 (two) times daily. 07/01/22  Yes McVey, Prudencio Pair, CNM  fluticasone (FLONASE) 50 MCG/ACT nasal spray Place 1 spray into both nostrils daily. 02/15/19  Yes [provider]  glyBURIDE (DIABETA) 1.25 MG tablet Take 250 mg by mouth daily with breakfast.   Yes [provider]  levothyroxine (SYNTHROID) 125 MCG tablet Take 125 mcg by mouth daily before breakfast.   Yes [provider]  metFORMIN (GLUCOPHAGE) 1000 MG tablet Take 500 mg by mouth 2 (two) times daily with a meal.   Yes [provider]  amoxicillin (AMOXIL) 875 MG tablet Take 875 mg by mouth in the morning and at bedtime. Patient not taking: Reported on 07/01/2022 01/31/21   [provider]  budesonide-formoterol (SYMBICORT) 80-4.5 MCG/ACT inhaler INHALE 2 PUFFS BY MOUTH DAILY (RINSE MOUTH WELL WITH WATER AFTER EACH USE) ASTHMA CONTROLLER MEDICATION 06/19/20   [provider]  cetirizine (ZYRTEC) 10 MG tablet Take 10 mg by mouth daily.  08/12/19   [provider]  ESTARYLLA 0.25-35 MG-MCG tablet Take 1 tablet by mouth daily. Patient not taking: Reported on 07/01/2022 09/03/21   [provider]  ibuprofen (ADVIL) 600 MG  tablet Take 1 tablet (600 mg total) by mouth every 6 (six) hours as needed for mild pain, moderate pain or cramping. Patient taking differently: Take 800 mg by mouth every 6 (six) hours as needed for mild pain, moderate pain or cramping. 05/22/20   Genia Del, CNM  lidocaine  (LIDODERM) 5 % APPLY 1 PATCH TO SKIN ONCE DAILY (APPLY FOR 12 HOURS, THEN REMOVE FOR 12 HOURS) CUT IN HALF AND APPLY TO NECK AND LOWER BACK Patient not taking: Reported on 07/01/2022 06/19/20   [provider]  loratadine (CLARITIN) 10 MG tablet Take 1 tablet by mouth daily. 03/15/21   [provider]  medroxyPROGESTERone (PROVERA) 10 MG tablet Take 10 mg by mouth daily. 10 days. 08/26/21 09/05/21  [provider]  meloxicam (MOBIC) 15 MG tablet Take 1 tablet by mouth daily. Patient not taking: Reported on 07/01/2022 04/05/21   [provider]  montelukast (SINGULAIR) 10 MG tablet Take 10 mg by mouth daily. Patient not taking: Reported on 07/01/2022 03/15/21   [provider]  Multiple Vitamin (MULTIVITAMIN) tablet Take 1 tablet by mouth daily.    [provider]  norgestimate-ethinyl estradiol (ORTHO-CYCLEN) 0.25-35 MG-MCG tablet Take 1 tablet by mouth daily. Patient not taking: Reported on 07/01/2022 09/03/21 09/03/22  [provider]  pantoprazole (PROTONIX) 40 MG tablet Take 1 tablet (40 mg total) by mouth daily. 07/02/22   McVey, Prudencio Pair, CNM     Prenatal care site:  [facility]  OB History  Gravida Para Term Preterm AB Living  2 1 1  0 0 1  SAB IAB Ectopic Multiple Live Births  0 0 0 0 1    # Outcome Date GA Lbr Len/2nd Weight Sex Delivery Anes PTL Lv  2 Current           1 Term 05/20/20 [redacted]w[redacted]d   M Vag-Spont EPI  LIV     Complications: Preeclampsia     Social History: She  reports that she has never smoked. She has never used smokeless tobacco. She reports that she does not currently use alcohol. She reports that she does not currently use drugs.  Family History: family history includes Breast cancer in her maternal aunt; Diabetes in her maternal grandmother.   Review of Systems: A full review of systems was performed and negative except as noted in the HPI.     Physical Exam:  Vital Signs: BP 114/71 (BP Location: Left  Arm)   Pulse 79   Temp 98.3 F (36.8 C) (Oral)   Resp 18   Ht 5\' 4"  (1.626 m)   Wt 91.6 kg   LMP 11/02/2021 (Approximate)   BMI 34.67 kg/m   General: no acute distress.  HEENT: normocephalic, atraumatic Heart: regular rate & rhythm Lungs: normal respiratory effort Abdomen: soft, gravid, non-tender;  EFW: 6lbs Pelvic:   External: Normal external female genitalia  Cervix: Dilation: 2.5 /   /      Extremities: non-tender, symmetric, no edema bilaterally.  DTRs: +2  Neurologic: Alert & oriented x 3.    Results for orders placed or performed during the hospital encounter of 07/28/22 (from the past 24 hour(s))  CBC     Status: Abnormal   Collection Time: 07/28/22  8:09 AM  Result Value Ref Range   WBC 7.3 4.0 - 10.5 K/uL   RBC 4.12 3.87 - 5.11 MIL/uL   Hemoglobin 11.7 (L) 12.0 - 15.0 g/dL   HCT 07/30/22 07/30/22 - 16.1 %   MCV 87.6 80.0 -  100.0 fL   MCH 28.4 26.0 - 34.0 pg   MCHC 32.4 30.0 - 36.0 g/dL   RDW 82.9 56.2 - 13.0 %   Platelets 234 150 - 400 K/uL   nRBC 0.0 0.0 - 0.2 %  Type and screen     Status: None (Preliminary result)   Collection Time: 07/28/22  8:47 AM  Result Value Ref Range   ABO/RH(D) PENDING    Antibody Screen PENDING    Sample Expiration      07/31/2022,2359 Performed at [facility], [address] [place], [place] [address]   Glucose, capillary     Status: Abnormal   Collection Time: 07/28/22  9:35 AM  Result Value Ref Range   Glucose-Capillary 69 (L) 70 - 99 mg/dL    Pertinent Results:  Prenatal Labs: Blood type/Rh PENDING   Antibody screen Negative    Rubella Immune    Varicella Immune  RPR   NR  HBsAg  NEG  Hep C NR   HIV   Neg  GC neg  Chlamydia neg  Genetic screening cfDNA negative   1 hour GTT 187  3 hour GTT 94,202,162,104  GBS   neg   FHT:  FHR: 145 bpm, variability: moderate,  accelerations:  Present,  decelerations:  Absent Category/reactivity:  Category I UC:   irregular, every 2-5 minutes   Cephalic by Leopolds  and SVE   No results found.  Assessment:  Andra Heslin is a 41 y.o. G51P1001 female at [redacted]w[redacted]d with GDM.   Plan:  1. Admit to Labor & Delivery - consents reviewed and obtained -Pre- e labs obtained -Inductions started with Cooks Cath balloon and Pitocin -Dr. Jean Rosenthal aware of admission  2. Fetal Well being  - Fetal Tracing: Cat 1 - Group B Streptococcus ppx not indicated: GBS negative - Presentation: Cephalic confirmed by sve   3. Routine OB: - Prenatal labs reviewed, as above - Rh positive - CBC, T&S, RPR on admit - Clear liquid diet , continuous IV fluids  4. Induction of labor  - Contractions monitored with external toco - Pelvis proven - Plan for induction with oxytocin and cervical balloon  - Augmentation with oxytocin and AROM as appropriate  - Plan for  continuous fetal monitoring - Maternal pain control as desired; planning regional anesthesia, IVPM, and position changes  - Anticipate vaginal delivery  5. Post Partum Planning: - Infant feeding: breast feeding - Contraception: no method - Tdap vaccine: Given prenatally - Flu vaccine:  not in season  Macon Lesesne LUCY Delmar Landau, CNM 07/28/22 9:50 AM  Chari Manning, CNM Certified Nurse Midwife [facility]

## 2022-07-28 NOTE — Progress Notes (Addendum)
Labor Check  Subj:  Complaints: feeling pain with contractions   Obj:  BP 119/78   Pulse 75   Temp (!) 97.5 F (36.4 C) (Oral)   Resp 18   Ht 5\' 4"  (1.626 m)   Wt 91.6 kg   LMP 11/02/2021 (Approximate)   BMI 34.67 kg/m  Dose (milli-units/min) Oxytocin: 8 milli-units/min  Cervix: Dilation: 2.5 /   /    Baseline FHR: 145 beats/min   Variability: moderate   Accelerations: present   Decelerations: present Contractions: present frequency: 2-4 Overall assessment: reassuring  Female chaperone present for pelvic exam:   A/P: 41 y.o. G2P1001 female at [redacted]w[redacted]d with A2GDM.  1.  Labor: progressing normally with cooks cath and Pitocin, BG 69, pre-e labs wnl 2.  FWB: ,Overall assessment: category cat 1  3.  GBS neg  4.  Pain: 7/10   [redacted]w[redacted]d [facility] 07/28/2022 1:09 PM

## 2022-07-28 NOTE — Progress Notes (Signed)
Labor Check  Subj:  Complaints: feeling light headed    Obj:  BP (!) 110/56   Pulse 82   Temp 98.6 F (37 C) (Oral)   Resp 18   Ht 5\' 4"  (1.626 m)   Wt 91.6 kg   LMP 11/02/2021 (Approximate)   SpO2 100%   BMI 34.67 kg/m  Dose (milli-units/min) Oxytocin: 0 milli-units/min  Cervix: Dilation: 7 / Effacement (%): 70 / Station: -1  Baseline FHR: 155 beats/min   Variability: moderate   Accelerations: present   Decelerations: present Contractions: present frequency: 4-5 Overall assessment: reassuring    A/P: 41 y.o. G2P1001 female at [redacted]w[redacted]d with GDM A2 patient laid down after epidural, BP noted to be hypotensive ephedrine given x2 for blood pressure support. Repetitive lates noted on FHT, anesthesia notified.  Pitocin stopped, IV fluid bolus given, and maternal position changes done for intrauterine resuscitation. 1.  Labor: progressing normally, with restart Pitocin after a 20 min reactive FHT.  2.  FWB: , Overall assessment: category 2 with intermittent shallow variables and early decelerations. 3.  GBS neg  4.  Pain: 0/10   [redacted]w[redacted]d [facility] 07/28/2022 10:41 PM

## 2022-07-28 NOTE — Progress Notes (Signed)
Labor Check  Subj:  Complaints: feeling pain with contractions   Obj:  BP 124/82 (BP Location: Left Arm)   Pulse 75   Temp 98.5 F (36.9 C) (Oral)   Resp 20   Ht 5\' 4"  (1.626 m)   Wt 91.6 kg   LMP 11/02/2021 (Approximate)   BMI 34.67 kg/m  Dose (milli-units/min) Oxytocin: 14 milli-units/min  Cervix: Dilation: 6 / Effacement (%): 60 / Station: -1  Baseline FHR: 150 beats/min   Variability: moderate   Accelerations: present   Decelerations: present, intermittent lates and shallow variables Contractions: present frequency: 4 Overall assessment: Category 2  Female chaperone present for pelvic exam:   A/P: 41 y.o. G2P1001 female at [redacted]w[redacted]d with GDM A2.  1.  Labor: progressing normally, cooks cath removed, AROM performed for clear fluid, 14 mil/units of Pitocin  2.  FWB:  Overall assessment: category 2  3.  GBS neg  4.  Pain: 6/10 5.  Recheck: 6/60/-1   [redacted]w[redacted]d [facility] 07/28/2022 7:36 PM

## 2022-07-28 NOTE — Inpatient Diabetes Management (Signed)
ADA Standards of Care 2023 Diabetes in Pregnancy Target Glucose Ranges:  Fasting: 70 - 95 mg/dL 1 hr postprandial:  956 - 140mg /dL (from first bite of meal) 2 hr postprandial:  100 - 120 mg/dL (from first bit of meal)    Latest Reference Range & Units 07/28/22 09:35 07/28/22 13:35  Glucose-Capillary 70 - 99 mg/dL 69 (L) 64 (L)  (L): Data is abnormally low   Admit for IOL  History: GDM  Home DM Meds: Glyburide 2.5 mg BID        Metformin 1000 mg BID  Current Orders: CBGs Q4 hours   Will likely not need oral DM medications post-delivery  Will follow   --Will follow patient during hospitalization--  07/30/22 RN, MSN, CDCES Diabetes Coordinator Inpatient Glycemic Control Team Team Pager: [phone] (8a-5p)

## 2022-07-28 NOTE — Anesthesia Procedure Notes (Signed)
Epidural Patient location during procedure: OB Start time: 07/28/2022 9:21 PM End time: 07/28/2022 9:43 PM  Staffing Anesthesiologist: Stephanie Coup, MD Performed: anesthesiologist   Preanesthetic Checklist Completed: patient identified, IV checked, site marked, risks and benefits discussed, surgical consent, monitors and equipment checked, pre-op evaluation and timeout performed  Epidural Patient position: sitting Prep: Betadine Patient monitoring: heart rate, continuous pulse ox and blood pressure Approach: midline Location: L3-L4 Injection technique: LOR saline  Needle:  Needle type: Tuohy  Needle gauge: 18 G Needle length: 9 cm and 9 Needle insertion depth: 6 cm Catheter type: closed end flexible Catheter size: 20 Guage Catheter at skin depth: 10 cm Test dose: negative and 1.5% lidocaine with Epi 1:200 K  Assessment Sensory level: T4 Events: blood not aspirated, injection not painful, no injection resistance, no paresthesia and negative IV test  Additional Notes   Patient tolerated the insertion well without complications.Reason for block:procedure for pain

## 2022-07-29 ENCOUNTER — Encounter: Payer: Self-pay | Admitting: Obstetrics and Gynecology

## 2022-07-29 LAB — CBC
HCT: 30.3 % — ABNORMAL LOW (ref 36.0–46.0)
Hemoglobin: 9.9 g/dL — ABNORMAL LOW (ref 12.0–15.0)
MCH: 28.6 pg (ref 26.0–34.0)
MCHC: 32.7 g/dL (ref 30.0–36.0)
MCV: 87.6 fL (ref 80.0–100.0)
Platelets: 218 10*3/uL (ref 150–400)
RBC: 3.46 MIL/uL — ABNORMAL LOW (ref 3.87–5.11)
RDW: 13.3 % (ref 11.5–15.5)
WBC: 12.9 10*3/uL — ABNORMAL HIGH (ref 4.0–10.5)
nRBC: 0 % (ref 0.0–0.2)

## 2022-07-29 LAB — RPR: RPR Ser Ql: NONREACTIVE

## 2022-07-29 MED ORDER — WITCH HAZEL-GLYCERIN EX PADS
1.0000 | MEDICATED_PAD | CUTANEOUS | Status: DC | PRN
  Administered 2022-07-29: 1 via TOPICAL
  Filled 2022-07-29: qty 100

## 2022-07-29 MED ORDER — BISACODYL 10 MG RE SUPP: 10.0000 mg | Freq: Every day | RECTAL | Status: DC | PRN

## 2022-07-29 MED ORDER — ONDANSETRON HCL 4 MG/2ML IJ SOLN: 4.0000 mg | INTRAMUSCULAR | Status: DC | PRN

## 2022-07-29 MED ORDER — SODIUM CHLORIDE 0.9 % IV SOLN
250.0000 mL | INTRAVENOUS | Status: DC | PRN
  Administered 2022-07-29: 250 mL via INTRAVENOUS

## 2022-07-29 MED ORDER — SODIUM CHLORIDE 0.9% FLUSH
3.0000 mL | Freq: Two times a day (BID) | INTRAVENOUS | Status: DC
  Administered 2022-07-30 (×2): 3 mL via INTRAVENOUS

## 2022-07-29 MED ORDER — SODIUM CHLORIDE 0.9% FLUSH: 3.0000 mL | INTRAVENOUS | Status: DC | PRN

## 2022-07-29 MED ORDER — DIBUCAINE (PERIANAL) 1 % EX OINT
1.0000 | TOPICAL_OINTMENT | CUTANEOUS | Status: DC | PRN
  Administered 2022-07-29: 1 via RECTAL
  Filled 2022-07-29: qty 28

## 2022-07-29 MED ORDER — LEVOTHYROXINE SODIUM 150 MCG PO TABS
150.0000 ug | ORAL_TABLET | Freq: Every day | ORAL | Status: DC
  Administered 2022-07-29 – 2022-07-30 (×2): 150 ug via ORAL
  Filled 2022-07-29 (×2): qty 1

## 2022-07-29 MED ORDER — SENNOSIDES-DOCUSATE SODIUM 8.6-50 MG PO TABS
2.0000 | ORAL_TABLET | ORAL | Status: DC
  Administered 2022-07-29 – 2022-07-30 (×2): 2 via ORAL
  Filled 2022-07-29 (×2): qty 2

## 2022-07-29 MED ORDER — FLEET ENEMA 7-19 GM/118ML RE ENEM: 1.0000 | ENEMA | Freq: Every day | RECTAL | Status: DC | PRN

## 2022-07-29 MED ORDER — SODIUM CHLORIDE 0.9 % IV SOLN
300.0000 mg | Freq: Once | INTRAVENOUS | Status: AC
  Administered 2022-07-29: 300 mg via INTRAVENOUS
  Filled 2022-07-29: qty 300

## 2022-07-29 MED ORDER — DIPHENHYDRAMINE HCL 25 MG PO CAPS: 25.0000 mg | ORAL_CAPSULE | Freq: Four times a day (QID) | ORAL | Status: DC | PRN

## 2022-07-29 MED ORDER — OXYCODONE HCL 5 MG PO TABS: 5.0000 mg | ORAL_TABLET | ORAL | Status: DC | PRN

## 2022-07-29 MED ORDER — SODIUM CHLORIDE 0.9 % IV SOLN: 2.0000 g | Freq: Four times a day (QID) | INTRAVENOUS | Status: DC

## 2022-07-29 MED ORDER — SIMETHICONE 80 MG PO CHEW: 80.0000 mg | CHEWABLE_TABLET | ORAL | Status: DC | PRN

## 2022-07-29 MED ORDER — ONDANSETRON HCL 4 MG PO TABS: 4.0000 mg | ORAL_TABLET | ORAL | Status: DC | PRN

## 2022-07-29 MED ORDER — BENZOCAINE-MENTHOL 20-0.5 % EX AERO
1.0000 | INHALATION_SPRAY | CUTANEOUS | Status: DC | PRN
  Administered 2022-07-29: 1 via TOPICAL
  Filled 2022-07-29: qty 56

## 2022-07-29 MED ORDER — GENTAMICIN SULFATE 40 MG/ML IJ SOLN: 5.0000 mg/kg | INTRAVENOUS | Status: DC

## 2022-07-29 MED ORDER — ACETAMINOPHEN 325 MG PO TABS
650.0000 mg | ORAL_TABLET | ORAL | Status: DC | PRN
  Administered 2022-07-29 (×3): 650 mg via ORAL
  Filled 2022-07-29 (×2): qty 2

## 2022-07-29 MED ORDER — IBUPROFEN 600 MG PO TABS
600.0000 mg | ORAL_TABLET | Freq: Four times a day (QID) | ORAL | Status: DC
  Administered 2022-07-29 – 2022-07-30 (×5): 600 mg via ORAL
  Filled 2022-07-29 (×6): qty 1

## 2022-07-29 MED ORDER — PRENATAL MULTIVITAMIN CH
1.0000 | ORAL_TABLET | Freq: Every day | ORAL | Status: DC
  Administered 2022-07-29 – 2022-07-30 (×2): 1 via ORAL
  Filled 2022-07-29 (×2): qty 1

## 2022-07-29 MED ORDER — COCONUT OIL OIL: 1.0000 | TOPICAL_OIL | Status: DC | PRN

## 2022-07-29 MED ORDER — ZOLPIDEM TARTRATE 5 MG PO TABS: 5.0000 mg | ORAL_TABLET | Freq: Every evening | ORAL | Status: DC | PRN

## 2022-07-29 NOTE — Progress Notes (Signed)
Post Partum Day 0  Subjective: Doing well, no complaints.  Tolerating regular diet, pain with PO meds, voiding and ambulating without difficulty.  No CP SOB Fever,Chills, N/V or leg pain; denies nipple or breast pain; no HA change of vision, RUQ/epigastric pain  Objective: BP 110/76 (BP Location: Left Arm)   Pulse 66   Temp 98 F (36.7 C) (Oral)   Resp 20   Ht 5\' 4"  (1.626 m)   Wt 91.6 kg   LMP 11/02/2021 (Approximate)   SpO2 99%   Breastfeeding Unknown   BMI 34.67 kg/m    Physical Exam:  General: NAD Breasts: soft/nontender CV: RRR Pulm: nl effort, CTABL Abdomen: soft, NT, BS x 4 Perineum: minimal edema, laceration repair well approximated Lochia: moderate Uterine Fundus: fundus firm and 1 fb below umbilicus DVT Evaluation: no cords, ttp LEs   Recent Labs    07/28/22 0809 07/29/22 0529  HGB 11.7* 9.9*  HCT 36.1 30.3*  WBC 7.3 12.9*  PLT 234 218    Assessment/Plan: 41 y.o. G2P2002 postpartum day # 0  - Continue routine PP care - Lactation consult prn.  - Acute blood loss anemia - hemodynamically stable and asymptomatic; start po ferrous sulfate BID with stool softeners  - Venofer 300mg  IVPB dose to be given today prior to IV removal.  - hypothyroidism: synthroid 55 prior to admission, ordered now.  - Immunization status: all Imms up to date    Disposition: Does not desire Dc home today.     , CNM 07/29/2022  10:36 AM

## 2022-07-29 NOTE — Discharge Summary (Signed)
Obstetrical Discharge Summary  Patient Name: Jessica Knight DOB: [birth year] MRN: [id number]  Date of Admission: 07/28/2022 Date of Delivery: 07/29/22 Delivered by: Chari Manning CNM  Date of Discharge: 07/30/2022  Primary OB: [facility] GEX:[id number] last menstrual period was 11/02/2021 (approximate). EDC Estimated Date of Delivery: 08/09/22 Gestational Age at Delivery: [redacted]w[redacted]d   Antepartum complications:  Embryo transfer with REI GDM, taking Metformin and Glyburide Advanced maternal age Hypothyroid Obesity   Admitting Diagnosis: Encounter for induction of labor [Z34.90]  Secondary Diagnosis: Patient Active Problem List   Diagnosis Date Noted   Encounter for induction of labor 07/28/2022   Uterine contractions 07/18/2022   Right upper quadrant abdominal pain 07/01/2022   Supervision of high risk pregnancy in third trimester 01/06/2022   Myofascial pain syndrome of lumbar spine 05/06/2021   Sacroiliac joint pain 08/27/2020   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   Lumbar degenerative disc disease 08/27/2020   Lumbar radiculopathy (Right L5 EMG/NCV) 08/27/2020   Chronic pain syndrome 08/27/2020   Cervical radicular pain (right C5/6/7) 08/27/2020   Bilateral carpal tunnel syndrome 08/27/2020   Foraminal stenosis of cervical region 08/27/2020   PPH (postpartum hemorrhage) 05/20/2020   Preeclampsia 05/20/2020   Gestational diabetes mellitus (GDM) requiring insulin 05/18/2020    Discharge Diagnosis: Term Pregnancy Delivered and GDM A2      Augmentation: AROM and Pitocin Complications: None Intrapartum complications/course: Jessica Knight presented to L&D for IOL for GDM A2. She was 2.5/50/-2. She progressed  to C/C/+2 with a spontaneous urge to push.  She pushed  effectively over approximately 49 minutes for a spontaneous vaginal birth.  Delivery of a viable baby girl on 07/29/22 at 0004 by CNM Delivery of fetal head in OA position with restitution to ROA. no nuchal cord;  1  minute 18 second should dystocia resolved by McRoberts and suprapubic pressure  Delivery Type: spontaneous vaginal delivery Anesthesia: epidural anesthesia Placenta: spontaneous To Pathology: No  Laceration: none Episiotomy: none Newborn Data: Live born female "Emma" Birth Weight:  7lb 3oz APGAR: 8, 9  Newborn Delivery   Time head delivered: 07/29/2022 00:02:00 Birth date/time: 07/29/2022 00:04:00 Delivery type: Vaginal, Spontaneous      Postpartum Procedures:  Venofer iron transfusion [place]:     07/29/2022    9:46 AM 05/20/2020    2:52 PM  Edinburgh Postnatal Depression Scale Screening Tool  I have been able to laugh and see the funny side of things. 0 0  I have looked forward with enjoyment to things. 0 0  I have blamed myself unnecessarily when things went wrong. 1 0  I have been anxious or worried for no good reason. 1 0  I have felt scared or panicky for no good reason. 1 0  Things have been getting on top of me. 1 0  I have been so unhappy that I have had difficulty sleeping. 0 0  I have felt sad or miserable. 1 1  I have been so unhappy that I have been crying. 1 0  The thought of harming myself has occurred to me. 0 0  Edinburgh Postnatal Depression Scale Total 6 1     Post partum course:   Patient had an uncomplicated postpartum course.  By time of discharge on PPD#1, her pain was controlled on oral pain medications; she had appropriate lochia and was ambulating, voiding without difficulty and tolerating regular diet.  She was deemed stable for discharge to home.    Discharge Physical Exam:   BP 106/74 (BP Location: Left Arm)  Pulse 69   Temp 98.7 F (37.1 C) (Oral)   Resp 18   Ht 5\' 4"  (1.626 m)   Wt 91.6 kg   LMP 11/02/2021 (Approximate)   SpO2 100%   Breastfeeding Unknown   BMI 34.67 kg/m   General: NAD CV: RRR Pulm: CTABL, nl effort ABD: s/nd/nt, fundus firm and below the umbilicus Lochia: moderate Perineum:minimal edema/intact DVT  Evaluation: LE non-ttp, no evidence of DVT on exam.  Hemoglobin  Date Value Ref Range Status  07/29/2022 9.9 (L) 12.0 - 15.0 g/dL Final   HCT  Date Value Ref Range Status  07/29/2022 30.3 (L) 36.0 - 46.0 % Final    Risk assessment for postpartum VTE and prophylactic treatment: Very high risk factors: None High risk factors: None Moderate risk factors: None  Postpartum VTE prophylaxis with LMWH not indicated  Disposition: stable, discharge to home. Baby Feeding: breast feeding Baby Disposition: home with mom  Rh Immune globulin indicated: No Rubella vaccine given: was not indicated Varivax vaccine given: was not indicated Flu vaccine given in AP setting: n/a Tdap vaccine given in AP setting: Yes   Contraception: no method  Prenatal Labs:  Blood type/Rh A Pos  Antibody screen Negative    Rubella Immune    Varicella Immune  RPR   NR  HBsAg  NEG  Hep C NR   HIV   Neg  GC neg  Chlamydia neg  Genetic screening cfDNA negative   1 hour GTT 187  3 hour GTT 94,202,162,104  GBS   neg    Plan:  Jessica Knight was discharged to home in good condition. Follow-up appointment with delivering provider in 6 weeks.   Discharge Medications: Allergies as of 07/30/2022   No Known Allergies      Medication List     STOP taking these medications    amoxicillin 875 MG tablet Commonly known as: AMOXIL   Estarylla 0.25-35 MG-MCG tablet Generic drug: norgestimate-ethinyl estradiol   famotidine 20 MG tablet Commonly known as: PEPCID   glyBURIDE 1.25 MG tablet Commonly known as: DIABETA   lidocaine 5 % Commonly known as: LIDODERM   medroxyPROGESTERone 10 MG tablet Commonly known as: PROVERA   meloxicam 15 MG tablet Commonly known as: MOBIC   metFORMIN 1000 MG tablet Commonly known as: GLUCOPHAGE   montelukast 10 MG tablet Commonly known as: SINGULAIR   multivitamin tablet   norgestimate-ethinyl estradiol 0.25-35 MG-MCG tablet Commonly known as:  ORTHO-CYCLEN   pantoprazole 40 MG tablet Commonly known as: PROTONIX       TAKE these medications    acetaminophen 325 MG tablet Commonly known as: Tylenol Take 2 tablets (650 mg total) by mouth every 6 (six) hours as needed (for pain scale < 4).   Albuterol Sulfate 108 (90 Base) MCG/ACT Aepb Commonly known as: PROAIR RESPICLICK Inhale 2 puffs into the lungs every 6 (six) hours as needed.   benzocaine-Menthol 20-0.5 % Aero Commonly known as: DERMOPLAST Apply 1 Application topically as needed for irritation (perineal discomfort).   budesonide-formoterol 80-4.5 MCG/ACT inhaler Commonly known as: SYMBICORT INHALE 2 PUFFS BY MOUTH DAILY (RINSE MOUTH WELL WITH WATER AFTER EACH USE) ASTHMA CONTROLLER MEDICATION   carboxymethylcellulose 0.5 % Soln Commonly known as: REFRESH PLUS Apply 1 drop to eye as needed.   cetirizine 10 MG tablet Commonly known as: ZYRTEC Take 10 mg by mouth daily.   fluticasone 50 MCG/ACT nasal spray Commonly known as: FLONASE Place 1 spray into both nostrils daily.   ibuprofen 600 MG tablet  Commonly known as: ADVIL Take 1 tablet (600 mg total) by mouth every 6 (six) hours. What changed:  when to take this reasons to take this   levothyroxine 150 MCG tablet Commonly known as: SYNTHROID Take 1 tablet (150 mcg total) by mouth daily at 6 (six) AM. Start taking on: July 31, 2022 What changed:  medication strength how much to take when to take this   loratadine 10 MG tablet Commonly known as: CLARITIN Take 1 tablet by mouth daily.   prenatal multivitamin Tabs tablet Take 1 tablet by mouth daily at 12 noon.   senna-docusate 8.6-50 MG tablet Commonly known as: Senokot-S Take 2 tablets by mouth daily.   simethicone 80 MG chewable tablet Commonly known as: MYLICON Chew 1 tablet (80 mg total) by mouth as needed for flatulence.   witch hazel-glycerin pad Commonly known as: TUCKS Apply 1 Application topically as needed for hemorrhoids (for  pain).         Follow-up Information     Chari Manning Rolla Plate, CNM Follow up in 6 week(s).   Specialty: Obstetrics Why: pp visit and PP GTT Contact information: [address] [place] [address] [phone]                 Signed: [name], CNM 07/30/2022

## 2022-07-29 NOTE — Anesthesia Postprocedure Evaluation (Signed)
Anesthesia Post Note  Patient: Horticulturist, commercial  Procedure(s) Performed: AN AD HOC LABOR EPIDURAL  Patient location during evaluation: Nursing Unit Anesthesia Type: Epidural Level of consciousness: awake Pain management: pain level controlled Respiratory status: spontaneous breathing Postop Assessment: no headache Anesthetic complications: no   No notable events documented.   Last Vitals:  Vitals:   07/29/22 0345 07/29/22 0500  BP: 105/73 90/65  Pulse: 69 78  Resp: 17 17  Temp: 36.5 C 36.5 C  SpO2: 99% 100%    Last Pain:  Vitals:   07/29/22 0500  TempSrc: Oral  PainSc: 0-No pain                 Jaye Beagle

## 2022-07-29 NOTE — Lactation Note (Signed)
This note was copied from a baby's chart. Lactation Consultation Note  Patient Name: Jessica Knight [id number] Date: 07/29/2022 Reason for consult: Initial assessment;Breast reduction;Mother's request Age:43 hours Lactation to the room for initial visit. Mother is holding the baby swaddled. Mother is wanting to latch baby. LC encouraged her to unwrap baby and place the baby skin to skin. Baby is very fussy with a high pitched cry. Once she settled at breast positioned in football on the right. Baby is latching and then is slipping off the nipple. Able to express drops into her mouth. Mother with a hx of breast reduction 10-12 years ago from EEE down to C cup.  Mother attempted to BF her son but had latch difficulty and hated to pump but did so for 1 month.   Encouraged feeding on demand and with cues. If baby is not cueing encouraged hand expression and skin to skin. Taught proper technique for hand expression. Baby was left skin to skin with Mother and feeding on the left. Baby did better on the left by maintaining latch.  Encouraged 8 or more attempts in the first 24 hours and 8 or more good feeds after 24 HOL. Reviewed appropriate diapers for days of life and How to know your baby is getting enough to eat. Reviewed "Understanding Postpartum and Newborn Care" booklet at bedside. [facility] # left on board, encouraged to call for any assistance. Mother has no further questions at this time.    Maternal Data Has patient been taught Hand Expression?: Yes Does the patient have breastfeeding experience prior to this delivery?: Yes How long did the patient breastfeed?: 1 month  Feeding Mother's Current Feeding Choice: Breast Milk  LATCH Score Latch: Repeated attempts needed to sustain latch, nipple held in mouth throughout feeding, stimulation needed to elicit sucking reflex.  Audible Swallowing: A few with stimulation  Type of Nipple: Everted at rest and after stimulation  Comfort (Breast/Nipple):  Filling, red/small blisters or bruises, mild/mod discomfort (tender with latch)  Hold (Positioning): Assistance needed to correctly position infant at breast and maintain latch.  LATCH Score: 6   Lactation Tools Discussed/Used Tools: Pump Breast pump type: Double-Electric Breast Pump;Manual Pump Education: Setup, frequency, and cleaning;Milk Storage Reason for Pumping: hx: reduction, LMS Pumping frequency: encourgaed topump 8x's/24 hours after breastfeeds  Interventions Interventions: Breast feeding basics reviewed;Assisted with latch;Skin to skin;Hand express;Breast compression;Adjust position;Support pillows;Position options;DEBP;Education  Discharge Discharge Education: Warning signs for feeding baby;Engorgement and breast care Pump: DEBP;Manual;Personal (has a Spectra S9,  used Medela previously did not like it)  Consult Status Consult Status: Follow-up    Gustav Knueppel D Godson Pollan 07/29/2022, 11:12 AM

## 2022-07-30 MED ORDER — SIMETHICONE 80 MG PO CHEW: 80.0000 mg | CHEWABLE_TABLET | ORAL | 0 refills | Status: DC | PRN

## 2022-07-30 MED ORDER — ACETAMINOPHEN 325 MG PO TABS: 650.0000 mg | ORAL_TABLET | Freq: Four times a day (QID) | ORAL | Status: AC | PRN

## 2022-07-30 MED ORDER — WITCH HAZEL-GLYCERIN EX PADS: 1.0000 | MEDICATED_PAD | CUTANEOUS | 12 refills | Status: AC | PRN

## 2022-07-30 MED ORDER — LEVOTHYROXINE SODIUM 150 MCG PO TABS: 150.0000 ug | ORAL_TABLET | Freq: Every day | ORAL | 1 refills | Status: AC

## 2022-07-30 MED ORDER — IBUPROFEN 600 MG PO TABS: 600.0000 mg | ORAL_TABLET | Freq: Four times a day (QID) | ORAL | 0 refills | Status: AC

## 2022-07-30 MED ORDER — BENZOCAINE-MENTHOL 20-0.5 % EX AERO: 1.0000 | INHALATION_SPRAY | CUTANEOUS | Status: AC | PRN

## 2022-07-30 MED ORDER — PRENATAL MULTIVITAMIN CH: 1.0000 | ORAL_TABLET | Freq: Every day | ORAL | Status: AC

## 2022-07-30 MED ORDER — SENNOSIDES-DOCUSATE SODIUM 8.6-50 MG PO TABS: 2.0000 | ORAL_TABLET | ORAL | Status: AC

## 2022-07-30 NOTE — Progress Notes (Signed)
Feeding Team F/U Note:  Patient Details Name: Jessica Knight MRN: [id number] DOB: 1981/03/28   Note: Per Neonatologist MD, me w/ Mother to answer any feeding questions, any positioning questions in order to support infant during feedings. Offered a Dr. Theora Gianotti bottle setup in setting of Mother not having a bottle at home and for a more controlled flow rate vs the rubber Similac nipple. Mother was grateful stating she had used Dr. Theora Gianotti bottle/system w/ previous child. Provided Parent Information ([employer]) also. Mother had no further questions, concerns. MD present to support D/C. NSG made aware.          [name], MS, CCC-SLP Speech Language Pathologist; Feeding Team Rehab Services; [facility] [place] [phone] (ascom) [name] 07/30/2022, 3:41 PM

## 2022-07-30 NOTE — Discharge Instructions (Signed)

## 2022-07-30 NOTE — Progress Notes (Signed)
Patient discharged home alone  Discharge instructions, when to follow up, and prescriptions reviewed with patient.  Patient verbalized understanding. Patient will be escorted out with student nurse. Pt insisted on driving herself home. CNM Danielle notified and is ok with pt's decision if she is unable to find someone to drive her.

## 2022-08-22 ENCOUNTER — Emergency Department: Payer: No Typology Code available for payment source

## 2022-08-22 ENCOUNTER — Emergency Department
Admission: EM | Admit: 2022-08-22 | Discharge: 2022-08-22 | Disposition: A | Discharge status: Home / Self Care | Payer: No Typology Code available for payment source | Attending: Emergency Medicine | Admitting: Emergency Medicine

## 2022-08-22 ENCOUNTER — Other Ambulatory Visit: Payer: Self-pay

## 2022-08-22 DIAGNOSIS — G5611 Other lesions of median nerve, right upper limb: Secondary | ICD-10-CM | POA: Diagnosis not present

## 2022-08-22 DIAGNOSIS — O99355 Diseases of the nervous system complicating the puerperium: Secondary | ICD-10-CM | POA: Insufficient documentation

## 2022-08-22 DIAGNOSIS — E039 Hypothyroidism, unspecified: Secondary | ICD-10-CM | POA: Insufficient documentation

## 2022-08-22 DIAGNOSIS — R202 Paresthesia of skin: Secondary | ICD-10-CM

## 2022-08-22 LAB — BASIC METABOLIC PANEL
Anion gap: 7 (ref 5–15)
BUN: 9 mg/dL (ref 6–20)
CO2: 26 mmol/L (ref 22–32)
Calcium: 9.2 mg/dL (ref 8.9–10.3)
Chloride: 107 mmol/L (ref 98–111)
Creatinine, Ser: 0.86 mg/dL (ref 0.44–1.00)
GFR, Estimated: >60 mL/min (ref >60)
Glucose, Bld: 70 mg/dL (ref 70–99)
Potassium: 3.9 mmol/L (ref 3.5–5.1)
Sodium: 140 mmol/L (ref 135–145)

## 2022-08-22 LAB — CBC WITH DIFFERENTIAL/PLATELET
Abs Immature Granulocytes: 0.02 10*3/uL (ref 0.00–0.07)
Basophils Absolute: 0 10*3/uL (ref 0.0–0.1)
Basophils Relative: 1 %
Eosinophils Absolute: 0.3 10*3/uL (ref 0.0–0.5)
Eosinophils Relative: 6 %
HCT: 38.2 % (ref 36.0–46.0)
Hemoglobin: 11.9 g/dL — ABNORMAL LOW (ref 12.0–15.0)
Immature Granulocytes: 0 %
Lymphocytes Relative: 48 %
Lymphs Abs: 2.2 10*3/uL (ref 0.7–4.0)
MCH: 28.2 pg (ref 26.0–34.0)
MCHC: 31.2 g/dL (ref 30.0–36.0)
MCV: 90.5 fL (ref 80.0–100.0)
Monocytes Absolute: 0.2 10*3/uL (ref 0.1–1.0)
Monocytes Relative: 5 %
Neutro Abs: 1.8 10*3/uL (ref 1.7–7.7)
Neutrophils Relative %: 40 %
Platelets: 268 10*3/uL (ref 150–400)
RBC: 4.22 MIL/uL (ref 3.87–5.11)
RDW: 14 % (ref 11.5–15.5)
WBC: 4.5 10*3/uL (ref 4.0–10.5)
nRBC: 0 % (ref 0.0–0.2)

## 2022-08-22 LAB — HEPATIC FUNCTION PANEL
ALT: 21 U/L (ref 0–44)
AST: 37 U/L (ref 15–41)
Albumin: 3.7 g/dL (ref 3.5–5.0)
Alkaline Phosphatase: 105 U/L (ref 38–126)
Bilirubin, Direct: <0.1 mg/dL (ref 0.0–0.2)
Total Bilirubin: 0.8 mg/dL (ref 0.3–1.2)
Total Protein: 7.2 g/dL (ref 6.5–8.1)

## 2022-08-22 NOTE — ED Notes (Signed)
First Nurse Note: Pt to ED via POV. Pt states that she has been having numbness in her right hand, arm, and leg since she had her baby 3 weeks a go. Pt states that her symptoms have been getting progressively worse over the past week. Pt called her PCP to make an appt but they would not see her in the office.

## 2022-08-22 NOTE — ED Notes (Signed)
See triage note  Presents with intermittent right arm/leg numbness which started 3 weeks ago  States it has been constant for 1 week Also has been having h/a's daily  Denies any fever or n/v

## 2022-08-22 NOTE — ED Triage Notes (Signed)
Pt arrives with c/o intermittent right hand and foot numbness since she had her baby 3 weeks ago. Pt has followed up with OB and had [place] that was normal. Pt endorses sharp pains down both extremities. Pt denies headache or gait disturbances.

## 2022-08-22 NOTE — ED Provider Notes (Signed)
[facility] Provider Note    Event Date/Time   First MD Initiated Contact with Patient 08/22/22 1121     (approximate)   History   Numbness   HPI  Ilee Pastora is a 41 y.o. female has a history of recent pregnancy with delivery, hypothyroidism   I reviewed the patient's recent discharge summary from her spontaneous vaginal delivery  For all roughly 1 week has had a steady numbness in the fingers of the his right hand demonstrating the second third and fourth digits.  Additionally has noticed some tingling and numbness on the edge of the lateral area of the right foot.  She reports the right hand fingers feel slightly weak.  Comes and goes, has been present for a few weeks now since about the time of delivery, but seems to be steadily worse for the last week with a numb feeling in her fingers of the right hand and sometimes the fingers also feel slightly weak  She is able to walk without difficulty.  She has had mild headaches that are relieved with ibuprofen since the time of the delivery.  No chest pain no trouble breathing.  No fevers or chills  No noted neck pain  She has not noticed any feeling of numbness or difficulty with use of the face head or neck.  She is breast-feeding  Physical Exam   Triage Vital Signs: ED Triage Vitals  Enc Vitals Group     BP 08/22/22 1027 132/84     Pulse Rate 08/22/22 1027 63     Resp 08/22/22 1027 16     Temp 08/22/22 1027 98.5 F (36.9 C)     Temp Source 08/22/22 1027 Oral     SpO2 08/22/22 1027 100 %     Weight 08/22/22 1028 190 lb (86.2 kg)     Height 08/22/22 1126 5' (1.524 m)     Head Circumference --      Peak Flow --      Pain Score --      Pain Loc --      Pain Edu? --      Excl. in [place]? --     Most recent vital signs: Vitals:   08/22/22 1027  BP: 132/84  Pulse: 63  Resp: 16  Temp: 98.5 F (36.9 C)  SpO2: 100%     General: Awake, no distress.  Very pleasant.  Walks from chair to bed  without difficulty.  Normal balance and gait. CV:  Good peripheral perfusion.  Normal heart tones Resp:  Normal effort.  Clear Abd:  No distention.  Other:  Moves all extremities with normal strength.  Demonstrates slight weakness with flexion of the third and fourth digits of the right hand, she also reports numbness over the right second third and fourth digits but is still able to sense sensation but feels very dulled.  Normal use of the left hand and left leg.  Right leg she reports mild paresthesia with decreased sensation over the right lateral foot.   ED Results / Procedures / Treatments   Labs (all labs ordered are listed, but only abnormal results are displayed) Labs Reviewed  CBC WITH DIFFERENTIAL/PLATELET - Abnormal; Notable for the following components:      Result Value   Hemoglobin 11.9 (*)    All other components within normal limits  BASIC METABOLIC PANEL  HEPATIC FUNCTION PANEL     EKG  Interpreted by me at 1147 heart rate 50 QRS 80  QTc 410 Normal sinus rhythm, minimal nonspecific T wave abnormality.  When compared to EKG from June 22 of 3 years ago this EKG appears to demonstrate no acute change   RADIOLOGY  MRI performed to evaluate for etiology such as prolactinoma, central tumor, mass, stroke in the setting of recent pregnancy and delivery.  Also cervical spine to evaluate for etiology as the patient does not have paresthesias or symptoms involving the face, wish to evaluate for possible spinal cord lesion   MR Cervical Spine Wo Contrast  Result Date: 08/22/2022 CLINICAL DATA:  Intermittent right hand and foot numbness; sharp pains down both extremities; 3 weeks postpartum EXAM: MRI CERVICAL SPINE WITHOUT CONTRAST TECHNIQUE: Multiplanar, multisequence MR imaging of the cervical spine was performed. No intravenous contrast was administered. COMPARISON:  09/18/2021 MRI cervical spine FINDINGS: Alignment: Straightening of the normal cervical lordosis. No listhesis.  Vertebrae: No fracture, evidence of discitis, or bone lesion. Cord: Normal signal and morphology. Posterior Fossa, vertebral arteries, paraspinal tissues: Low lying cerebellar tonsils, which extend 3 mm below the foramen magnum and do not meet criteria for Chiari I malformation. Disc levels: C2-C3: No significant disc bulge. No spinal canal stenosis or neuroforaminal narrowing. C3-C4: No significant disc bulge. Mild right uncovertebral hypertrophy. No spinal canal stenosis or neuroforaminal narrowing. C4-C5: No significant disc bulge. No spinal canal stenosis or neuroforaminal narrowing. C5-C6: Disc height loss and mild disc bulge. Uncovertebral hypertrophy. No spinal canal stenosis. Mild left and moderate right neural foraminal narrowing, unchanged. C6-C7: Mild disc bulge. Uncovertebral hypertrophy. No spinal canal stenosis. Mild right neural foraminal narrowing, unchanged C7-T1: No significant disc bulge. Right facet and uncovertebral hypertrophy. No spinal canal stenosis. Moderate right neural foraminal narrowing, unchanged. IMPRESSION: 1. Mild degenerative changes, with unchanged moderate right neural foraminal narrowing at C5-C6 and C7-T1, mild right neural foraminal narrowing at C6-C7, and mild left neural foraminal narrowing at C5-C6. 2. No spinal canal stenosis. Electronically Signed   By: Merilyn Baba M.D.   On: 08/22/2022 13:03   MR BRAIN WO CONTRAST  Result Date: 08/22/2022 CLINICAL DATA:  Intermittent right hand and foot numbness; 3 weeks postpartum EXAM: MRI HEAD WITHOUT CONTRAST TECHNIQUE: Multiplanar, multiecho pulse sequences of the brain and surrounding structures were obtained without intravenous contrast. COMPARISON:  None Available. FINDINGS: Brain: No restricted diffusion to suggest acute or subacute infarct. No acute hemorrhage, mass, mass effect, or midline shift. No hydrocephalus or extra-axial collection. No abnormal T2 hyperintense signal in the periventricular, juxtacortical, or  infratentorial white matter. No hemosiderin deposition to suggest remote hemorrhage. Normal pituitary. Low lying cerebellar tonsils, which extend 3 mm below the foramen magnum and do not meet criteria for Chiari I malformation. Vascular: Normal arterial flow voids. Skull and upper cervical spine: Normal marrow signal. Sinuses/Orbits: Mucous retention cysts in the maxillary sinuses. The orbits are unremarkable. Other: Trace fluid in the right mastoid air cells. IMPRESSION: No acute intracranial process. No evidence of acute or subacute infarct. Electronically Signed   By: Merilyn Baba M.D.   On: 08/22/2022 12:55    MRI reviewed, negative for acute intracranial process.  No acute spinal cord compression   PROCEDURES:  Critical Care performed: No  Procedures   MEDICATIONS ORDERED IN ED: Medications - No data to display   IMPRESSION / MDM / [place] / ED COURSE  I reviewed the triage vital signs and the nursing notes.  Differential diagnosis includes, but is not limited to, carpal tunnel, neuropathy possibly secondary to related to recent swelling or edema or hormonal changes related to pregnancy, preeclampsia though this seems quite unlikely with her normal blood pressure and normal LFTs normal, central lesion, stroke, also evaluate rule out spinal cord lesion or compression, radiculopathy, herniated nucleus pulposus of the cervical spine etc.  Her symptoms are most prominent in the right hand slight in the right foot.  I believe imaging of the brain and spine cervical spine is appropriate, and no symptoms of would be suggestive of a lower spinal lesion given the fact that her hand is also involved  Patient's presentation is most consistent with acute complicated illness / injury requiring diagnostic workup.   ----------------------------------------- 2:12 PM on 08/22/2022 ----------------------------------------- Work-up for central cause of  right-sided paresthesia is reassuring.  Based on clinical history suspect patient may be having some mild neuropathic symptoms, possibly median nerve compression or carpal tunnel like symptoms of the hand.  Discussed with her we will have her follow-up closely with her primary care but also recommended follow-up with hand surgeon for further evaluation.  Patient understanding agreeable with the plan.  Return precautions and treatment recommendations and follow-up discussed with the patient who is agreeable with the plan.   FINAL CLINICAL IMPRESSION(S) / ED DIAGNOSES   Final diagnoses:  Paresthesia  Median nerve dysfunction, right     Rx / DC Orders   ED Discharge Orders     None        Note:  This document was prepared using Dragon voice recognition software and may include unintentional dictation errors.   Delman Kitten, MD 08/22/22 [phone]

## 2022-10-03 ENCOUNTER — Other Ambulatory Visit: Payer: Self-pay | Admitting: Orthopedic Surgery

## 2022-10-03 DIAGNOSIS — M542 Cervicalgia: Secondary | ICD-10-CM

## 2022-10-10 ENCOUNTER — Ambulatory Visit
Admission: RE | Admit: 2022-10-10 | Discharge: 2022-10-10 | Disposition: A | Discharge status: Home / Self Care | Payer: No Typology Code available for payment source | Source: Ambulatory Visit | Attending: Orthopedic Surgery | Admitting: Orthopedic Surgery

## 2022-10-10 DIAGNOSIS — M542 Cervicalgia: Secondary | ICD-10-CM | POA: Insufficient documentation

## 2022-12-15 ENCOUNTER — Ambulatory Visit
Payer: No Typology Code available for payment source | Attending: Student in an Organized Health Care Education/Training Program | Admitting: Student in an Organized Health Care Education/Training Program

## 2022-12-15 ENCOUNTER — Encounter: Payer: Self-pay | Admitting: Student in an Organized Health Care Education/Training Program

## 2022-12-15 VITALS — BP 111/84 | HR 70 | Temp 97.2°F | Resp 16 | Ht 64.0 in | Wt 189.0 lb

## 2022-12-15 DIAGNOSIS — G894 Chronic pain syndrome: Secondary | ICD-10-CM

## 2022-12-15 DIAGNOSIS — M47812 Spondylosis without myelopathy or radiculopathy, cervical region: Secondary | ICD-10-CM | POA: Diagnosis present

## 2022-12-15 DIAGNOSIS — M5136 Other intervertebral disc degeneration, lumbar region: Secondary | ICD-10-CM | POA: Diagnosis present

## 2022-12-15 DIAGNOSIS — M542 Cervicalgia: Secondary | ICD-10-CM

## 2022-12-15 DIAGNOSIS — M4802 Spinal stenosis, cervical region: Secondary | ICD-10-CM

## 2022-12-15 DIAGNOSIS — M5412 Radiculopathy, cervical region: Secondary | ICD-10-CM

## 2022-12-15 DIAGNOSIS — M51369 Other intervertebral disc degeneration, lumbar region without mention of lumbar back pain or lower extremity pain: Secondary | ICD-10-CM

## 2022-12-15 MED ORDER — METHOCARBAMOL 500 MG PO TABS: 500.0000 mg | ORAL_TABLET | Freq: Every evening | ORAL | 0 refills | Status: AC | PRN

## 2022-12-15 NOTE — Patient Instructions (Signed)

## 2022-12-15 NOTE — Progress Notes (Signed)
PROVIDER NOTE: Information contained herein reflects review and annotations entered in association with encounter. Interpretation of such information and data should be left to medically-trained personnel. Information provided to patient can be located elsewhere in the medical record under "Patient Instructions". Document created using STT-dictation technology, any transcriptional errors that may result from process are unintentional.    Patient: Jessica Knight  Service Category: E/M  Provider: Gillis Santa, MD  DOB: [birth year]  DOS: 12/15/2022  Referring Provider: Juanda Chance  MRN: [id number]  Specialty: Interventional Pain Management  PCP: Mindi Curling, PA-C  Type: Established Patient  Setting: Ambulatory outpatient    Location: Office  Delivery: Face-to-face     HPI  Jessica Knight, a 42 y.o. year old female, is here today because of her Cervical radicular pain [M54.12]. Ms. Peace primary complain today is Back Pain and Shoulder Pain (Burn bilateral) Last encounter: My last encounter with her was on 09/25/21 Pertinent problems: Ms. Mitter has Sacroiliac joint pain; SI joint arthritis; Lumbar degenerative disc disease; Lumbar radiculopathy (Right L5 EMG/NCV); Chronic pain syndrome; Cervical radicular pain (right C5/6/7); Bilateral carpal tunnel syndrome; and Foraminal stenosis of cervical region on their pertinent problem list. Pain Assessment: Severity of Chronic pain is reported as a 8 /10. Location: Back Lower/right hip  down side of leg to foot. Onset: More than a month ago. Quality: Aching, Burning, Constant, Tingling (shoulders  bilateral down right arm  effects middle finger to the pinkie finger). Timing: Constant. Modifying factor(s): rest, exercise to work core, streching. Vitals:  height is 5\' 4"  (1.626 m) and weight is 189 lb (85.7 kg). Her temperature is 97.2 F (36.2 C) (abnormal). Her blood pressure is 111/84 and her pulse is 70. Her respiration is 16 and oxygen  saturation is 100%.  BMI: Estimated body mass index is 32.44 kg/m as calculated from the following:   Height as of this encounter: 5\' 4"  (1.626 m).   Weight as of this encounter: 189 lb (85.7 kg).  Reason for encounter: Jessica Knight follows up today for cervicalgia as well as bilateral shoulder pain.  She is also complaining of low back pain.  She had a child approximately 4 months ago and had a labor epidural associated with that delivery.  She states that her low back pain has increased since then.  She states that she has an upcoming lumbar MRI at the [place] on the 17th.  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  She has had 3 sets of cervical epidural steroid injections with me the last 1 was July 08, 2021 which unfortunately was not beneficial for her from an analgesic nor functional standpoint.  I do not recommend repeating.  My last visit with her was on 09/25/2021 at which point I recommended a nerve conduction velocity/EMG study to further workup peripheral nerve pathology that could be contributing to her symptoms.  I encouraged the patient to discuss this with her primary care provider and the [place].  In regards to her low back pain, I encouraged her to send me her lumbar MRI after she has completed it.  I will discuss treatment plan with her after I have reviewed it.  She is not breast-feeding at this time.  She states that she is experiencing increased lumbar paraspinal muscle spasms and that Flexeril is making her too drowsy.  I recommend that she supplement with magnesium which she states that she is already  doing.  She can also consider Robaxin 500 to 750 mg nightly.      ROS  Constitutional: Denies any fever or chills Gastrointestinal: No reported hemesis, hematochezia, vomiting, or acute GI distress Musculoskeletal:  Cervicalgia, bilateral shoulder pain, axial low back pain Neurological: No  reported episodes of acute onset apraxia, aphasia, dysarthria, agnosia, amnesia, paralysis, loss of coordination, or loss of consciousness  Medication Review  Albuterol Sulfate, acetaminophen, benzocaine-Menthol, budesonide-formoterol, carboxymethylcellulose, cetirizine, fluticasone, ibuprofen, levothyroxine, loratadine, methocarbamol, prenatal multivitamin, senna-docusate, simethicone, and witch hazel-glycerin  History Review  Allergy: Jessica Knight has No Known Allergies. Drug: Jessica Knight  reports that she does not currently use drugs. Alcohol:  reports that she does not currently use alcohol. Tobacco:  reports that she has never smoked. She has never used smokeless tobacco. Social: Jessica Knight  reports that she has never smoked. She has never used smokeless tobacco. She reports that she does not currently use alcohol. She reports that she does not currently use drugs. Medical:  has a past medical history of Asthma, Diabetes mellitus without complication (HCC), and Thyroid disease. Surgical: Jessica Knight  has a past surgical history that includes Breast surgery; LASIK; and Hernia repair. Family: family history includes Breast cancer in her maternal aunt; Diabetes in her maternal grandmother.  Laboratory Chemistry Profile   Renal Lab Results  Component Value Date   BUN 9 08/22/2022   CREATININE 0.86 08/22/2022   LABCREA 38 07/28/2022   GFRAA >60 05/21/2020   GFRNONAA >60 08/22/2022    Hepatic Lab Results  Component Value Date   AST 37 08/22/2022   ALT 21 08/22/2022   ALBUMIN 3.7 08/22/2022   ALKPHOS 105 08/22/2022   AMYLASE 80 07/01/2022   LIPASE 35 07/01/2022    Electrolytes Lab Results  Component Value Date   NA 140 08/22/2022   K 3.9 08/22/2022   CL 107 08/22/2022   CALCIUM 9.2 08/22/2022   MG 5.8 (H) 05/20/2020    Bone No results found for: "VD25OH", "VD125OH2TOT", "ZO1096EA5", "WU9811BJ4", "25OHVITD1", "25OHVITD2", "25OHVITD3", "TESTOFREE", "TESTOSTERONE"   Inflammation (CRP: Acute Phase) (ESR: Chronic Phase) No results found for: "CRP", "ESRSEDRATE", "LATICACIDVEN"       Note: Above Lab results reviewed.  Recent Imaging Review  MR CERVICAL SPINE WO CONTRAST CLINICAL DATA:  Sharp right-sided neck pain for 6 weeks with numbness in the 2nd, 3rd and 4th digits. Recent childbirth.  EXAM: MRI CERVICAL SPINE WITHOUT CONTRAST  TECHNIQUE: Multiplanar, multisequence MR imaging of the cervical spine was performed. No intravenous contrast was administered.  COMPARISON:  MRI cervical spine 08/22/2022 and 09/18/2021.  FINDINGS: Alignment: Physiologic.  Vertebrae: No acute or suspicious osseous findings.  Cord: Normal in signal and caliber.  Posterior Fossa, vertebral arteries, paraspinal tissues: Unchanged appearance of low lying cerebellar tonsils bilaterally. The visualized posterior fossa is otherwise unremarkable. Mild sphenoid sinus mucosal thickening noted.Bilateral vertebral artery flow voids. No significant paraspinal findings.  Disc levels:  C2-3: Normal interspace.  C3-4: Disc height and hydration are maintained. Stable right-sided uncinate spurring without resulting spinal stenosis or nerve root encroachment.  C4-5: Normal interspace.  C5-6: Loss of disc height with annular disc bulging, posterior osteophytes and uncinate spurring. No cord deformity. Moderate foraminal narrowing bilaterally, similar to previous studies.  C6-7: Stable mild loss of disc height with disc bulging and bilateral uncinate spurring. No cord deformity. Unchanged mild right foraminal narrowing.  C7-T1: Unchanged chronic mild to moderate right foraminal narrowing due to uncinate spurring and facet hypertrophy. The disc appears normal.  IMPRESSION: 1.  Stable MRI of the cervical spine, without acute findings. 2. Stable cervical spondylosis, greatest at C5-6 where there is moderate foraminal narrowing bilaterally. Unchanged  lesser right-sided foraminal narrowing at C6-7 and C7-T1. 3. No cord deformity or abnormal cord signal.  Electronically Signed   By: Richardean Sale M.D.   On: 10/13/2022 15:48 Note: Reviewed        Physical Exam  General appearance: Well nourished, well developed, and well hydrated. In no apparent acute distress Mental status: Alert, oriented x 3 (person, place, & time)       Respiratory: No evidence of acute respiratory distress Eyes: PERLA Vitals: BP 111/84   Pulse 70   Temp (!) 97.2 F (36.2 C)   Resp 16   Ht 5\' 4"  (1.626 m)   Wt 189 lb (85.7 kg)   SpO2 100%   BMI 32.44 kg/m  BMI: Estimated body mass index is 32.44 kg/m as calculated from the following:   Height as of this encounter: 5\' 4"  (1.626 m).   Weight as of this encounter: 189 lb (85.7 kg). Ideal: Ideal body weight: 54.7 kg (120 lb 9.5 oz) Adjusted ideal body weight: 67.1 kg (147 lb 15.3 oz)  Cervical Spine Exam  Skin & Axial Inspection: No masses, redness, edema, swelling, or associated skin lesions Alignment: Symmetrical Functional ROM: Pain restricted ROM     Positive Spurling's on the right Stability: No instability detected Muscle Tone/Strength: Functionally intact. No obvious neuro-muscular anomalies detected. Sensory (Neurological): Dermatomal pain pattern right C6, C7 Palpation: No palpable anomalies                          Upper Extremity (UE) Exam      Side: Right upper extremity   Side: Left upper extremity    Skin & Extremity Inspection: Skin color, temperature, and hair growth are WNL. No peripheral edema or cyanosis. No masses, redness, swelling, asymmetry, or associated skin lesions. No contractures.   Skin & Extremity Inspection: Skin color, temperature, and hair growth are WNL. No peripheral edema or cyanosis. No masses, redness, swelling, asymmetry, or associated skin lesions. No contractures.    Functional ROM: Pain restricted ROM for all joints of upper extremity   Functional ROM:  Unrestricted ROM            Muscle Tone/Strength: Movement possible against some resistance (4/5) elbow flexion, extension     Muscle Tone/Strength: Functionally intact. No obvious neuro-muscular anomalies detected.    Sensory (Neurological): Neurogenic pain pattern           Sensory (Neurological): Unimpaired            Palpation: No palpable anomalies               Palpation: No palpable anomalies                Provocative Test(s):  Phalen's test: deferred Tinel's test: deferred Apley's scratch test (touch opposite shoulder):  Action 1 (Across chest): Decreased ROM Action 2 (Overhead): Decreased ROM Action 3 (LB reach): Decreased ROM     Provocative Test(s):  Phalen's test: deferred Tinel's test: deferred Apley's scratch test (touch opposite shoulder):  Action 1 (Across chest): deferred Action 2 (Overhead): deferred Action 3 (LB reach): deferred        Lumbar Exam  Skin & Axial Inspection: No masses, redness, or swelling Alignment: Symmetrical Functional ROM: Pain restricted ROM       Stability: No instability detected Muscle Tone/Strength: Functionally  intact. No obvious neuro-muscular anomalies detected. Sensory (Neurological): Musculoskeletal pain pattern        Gait & Posture Assessment  Ambulation: Unassisted Gait: Relatively normal for age and body habitus Posture: WNL    Lower Extremity Exam      Side: Right lower extremity   Side: Left lower extremity  Stability: No instability observed           Stability: No instability observed          Skin & Extremity Inspection: Skin color, temperature, and hair growth are WNL. No peripheral edema or cyanosis. No masses, redness, swelling, asymmetry, or associated skin lesions. No contractures.   Skin & Extremity Inspection: Skin color, temperature, and hair growth are WNL. No peripheral edema or cyanosis. No masses, redness, swelling, asymmetry, or associated skin lesions. No contractures.  Functional ROM: Unrestricted ROM                    Functional ROM: Unrestricted ROM                  Muscle Tone/Strength: Functionally intact. No obvious neuro-muscular anomalies detected.   Muscle Tone/Strength: Functionally intact. No obvious neuro-muscular anomalies detected.  Sensory (Neurological): Unimpaired         Sensory (Neurological): Unimpaired        DTR: Patellar: deferred today Achilles: deferred today Plantar: deferred today   DTR: Patellar: deferred today Achilles: deferred today Plantar: deferred today  Palpation: No palpable anomalies   Palpation: No palpable anomalies       Assessment   Diagnosis   1. Cervical radicular pain (right C5/7)   2. Cervical spondylosis   3. Foraminal stenosis of cervical region   4. Cervicalgia   5. Lumbar degenerative disc disease   6. Chronic pain syndrome        Plan of Care   Consider upper extremity EMG study for paresthesias of right hand If EMG results are negative could consider diagnostic cervical facet medial branch nerve block Encourage patient to get me her lumbar MRI after she has completed it to discuss treatment plan Supplement with magnesium for muscle spasms, discontinue Flexeril.  Trial of Robaxin as well if magnesium not effective  Pharmacotherapy (Medications Ordered): Meds ordered this encounter  Medications   methocarbamol (ROBAXIN) 500 MG tablet    Sig: Take 1-1.5 tablets (500-750 mg total) by mouth at bedtime as needed for muscle spasms.    Dispense:  90 tablet    Refill:  0    Do not place this medication, or any other prescription from our practice, on "Automatic Refill". Patient may have prescription filled one day early if pharmacy is closed on scheduled refill date.   Orders:  No orders of the defined types were placed in this encounter.  Follow-up plan:   Return After lumbar MRI to discuss treatment plan; consider cervical facets after EMG if EMG negative.     Right C7-T1 ESI, bilateral sacroiliac joint injection 09/19/2020,  Right C7-T1 ESI #2 05/27/21, #3 07/08/21          Recent Visits No visits were found meeting these conditions. Showing recent visits within past 90 days and meeting all other requirements Today's Visits Date Type Provider Dept  12/15/22 Office Visit Edward Jolly, MD [facility]  Showing today's visits and meeting all other requirements Future Appointments No visits were found meeting these conditions. Showing future appointments within next 90 days and meeting all other requirements  I discussed the  assessment and treatment plan with the patient. The patient was provided an opportunity to ask questions and all were answered. The patient agreed with the plan and demonstrated an understanding of the instructions.  Patient advised to call back or seek an in-person evaluation if the symptoms or condition worsens.  Duration of encounter: .  Total time on encounter, as per AMA guidelines included both the face-to-face and non-face-to-face time personally spent by the physician and/or other qualified health care professional(s) on the day of the encounter (includes time in activities that require the physician or other qualified health care professional and does not include time in activities normally performed by clinical staff). Physician's time may include the following activities when performed: Preparing to see the patient (e.g., pre-charting review of records, searching for previously ordered imaging, lab work, and nerve conduction tests) Review of prior analgesic pharmacotherapies. Reviewing PMP Interpreting ordered tests (e.g., lab work, imaging, nerve conduction tests) Performing post-procedure evaluations, including interpretation of diagnostic procedures Obtaining and/or reviewing separately obtained history Performing a medically appropriate examination and/or evaluation Counseling and educating the patient/family/caregiver Ordering medications, tests, or  procedures Referring and communicating with other health care professionals (when not separately reported) Documenting clinical information in the electronic or other health record Independently interpreting results (not separately reported) and communicating results to the patient/ family/caregiver Care coordination (not separately reported)  Note by: Edward Jolly, MD Date: 12/15/2022; Time: 3:27 PM

## 2024-01-19 ENCOUNTER — Ambulatory Visit (INDEPENDENT_AMBULATORY_CARE_PROVIDER_SITE_OTHER): Payer: Managed Care, Other (non HMO) | Admitting: Physician Assistant

## 2024-01-19 ENCOUNTER — Encounter: Payer: Self-pay | Admitting: Physician Assistant

## 2024-01-19 VITALS — BP 110/77 | HR 65 | Ht 64.0 in | Wt 167.3 lb

## 2024-01-19 DIAGNOSIS — J302 Other seasonal allergic rhinitis: Secondary | ICD-10-CM | POA: Diagnosis not present

## 2024-01-19 DIAGNOSIS — E1169 Type 2 diabetes mellitus with other specified complication: Secondary | ICD-10-CM

## 2024-01-19 DIAGNOSIS — J4540 Moderate persistent asthma, uncomplicated: Secondary | ICD-10-CM

## 2024-01-19 DIAGNOSIS — Z1211 Encounter for screening for malignant neoplasm of colon: Secondary | ICD-10-CM

## 2024-01-19 DIAGNOSIS — E038 Other specified hypothyroidism: Secondary | ICD-10-CM

## 2024-01-19 DIAGNOSIS — G4709 Other insomnia: Secondary | ICD-10-CM

## 2024-01-19 DIAGNOSIS — Z7689 Persons encountering health services in other specified circumstances: Secondary | ICD-10-CM

## 2024-01-19 DIAGNOSIS — E119 Type 2 diabetes mellitus without complications: Secondary | ICD-10-CM

## 2024-01-19 DIAGNOSIS — Z7984 Long term (current) use of oral hypoglycemic drugs: Secondary | ICD-10-CM

## 2024-01-19 NOTE — Progress Notes (Signed)
 New patient visit  Patient: Jessica Knight   DOB: 08/31/1981   43 y.o. Female  MRN: [id number] Visit Date: 01/19/2024  Today's healthcare provider: Debera Lat, PA-C   Chief Complaint  Patient presents with   New Patient (Initial Visit)    Est care , no concerns, discuss [facility] for [facility]  discuss having issues with staying asleep    Subjective    Jessica Knight is a 43 y.o. female who presents today as a new patient to establish care.   Discussed the use of AI scribe software for clinical note transcription with the patient, who gave verbal consent to proceed.  History of Present Illness   The patient, with a known history of diabetes, asthma, and hypothyroidism, presents with concerns about her diabetes management and sleep disturbances. She is currently on metformin for diabetes, and albuterol and Symbicort for asthma. She also takes levothyroxine for her hypothyroidism.  The patient reports difficulty maintaining sleep, although she does not have trouble falling asleep. She has implemented good sleep hygiene practices, including a regular sleep schedule and a calming bedtime routine. However, she still experiences frequent awakenings during the night. She also mentions experiencing anxiety, which may be contributing to her sleep issues.  The patient has a young child and is currently working from home. She maintains an active lifestyle, including regular exercise and stretching routines. She also reports a history of gestational diabetes and high blood pressure during her pregnancy.        Past Medical History:  Diagnosis Date   Asthma    Diabetes mellitus without complication (HCC)    Thyroid disease    Past Surgical History:  Procedure Laterality Date   BREAST SURGERY     HERNIA REPAIR     LASIK     Family Status  Relation Name Status   Mat Aunt  (Not Specified)   MGM  Alive  No partnership data on file   Family History   Problem Relation Age of Onset   Breast cancer Maternal Aunt    Diabetes Maternal Grandmother    Social History   Socioeconomic History   Marital status: Married    Spouse name: emmanuel   Number of children: Not on file   Years of education: Not on file   Highest education level: Master's degree (e.g., MA, MS, MEng, MEd, MSW, MBA)  Occupational History   Not on file  Tobacco Use   Smoking status: Never   Smokeless tobacco: Never  Substance and Sexual Activity   Alcohol use: Not Currently    Comment: occas   Drug use: Not Currently   Sexual activity: Yes    Birth control/protection: None  Other Topics Concern   Not on file  Social History Narrative   Not on file   Social Drivers of Health   Financial Resource Strain: Low Risk  (01/18/2024)   Overall Financial Resource Strain (CARDIA)    Difficulty of Paying Living Expenses: Not hard at all  Food Insecurity: No Food Insecurity (01/18/2024)   Hunger Vital Sign    Worried About Running Out of Food in the Last Year: Never true    Ran Out of Food in the Last Year: Never true  Transportation Needs: No Transportation Needs (01/18/2024)   PRAPARE - Administrator, Civil Service (Medical): No    Lack of Transportation (Non-Medical): No  Physical Activity: Insufficiently Active (01/18/2024)   Exercise Vital Sign    Days of  Exercise per Week: 3 days    Minutes of Exercise per Session: 30 min  Stress: No Stress Concern Present (01/18/2024)   [employer] of Occupational Health - Occupational Stress Questionnaire    Feeling of Stress : Only a little  Social Connections: Moderately Integrated (01/18/2024)   Social Connection and Isolation Panel [NHANES]    Frequency of Communication with Friends and Family: More than three times a week    Frequency of Social Gatherings with Friends and Family: More than three times a week    Attends Religious Services: More than 4 times per year    Active Member of [employer] or  Organizations: Yes    Attends Engineer, structural: More than 4 times per year    Marital Status: Divorced   Outpatient Medications Prior to Visit  Medication Sig   acetaminophen (TYLENOL) 325 MG tablet Take 2 tablets (650 mg total) by mouth every 6 (six) hours as needed (for pain scale < 4).   Albuterol Sulfate 108 (90 Base) MCG/ACT AEPB Inhale 2 puffs into the lungs every 6 (six) hours as needed.   benzocaine-Menthol (DERMOPLAST) 20-0.5 % AERO Apply 1 Application topically as needed for irritation (perineal discomfort).   budesonide-formoterol (SYMBICORT) 80-4.5 MCG/ACT inhaler INHALE 2 PUFFS BY MOUTH DAILY (RINSE MOUTH WELL WITH WATER AFTER EACH USE) ASTHMA CONTROLLER MEDICATION   carboxymethylcellulose (REFRESH PLUS) 0.5 % SOLN Apply 1 drop to eye as needed.   cetirizine (ZYRTEC) 10 MG tablet Take 10 mg by mouth daily.    fluticasone (FLONASE) 50 MCG/ACT nasal spray Place 1 spray into both nostrils daily.   ibuprofen (ADVIL) 600 MG tablet Take 1 tablet (600 mg total) by mouth every 6 (six) hours.   ibuprofen (ADVIL) 800 MG tablet Take 800 mg by mouth every 8 (eight) hours as needed.   levothyroxine (SYNTHROID) 150 MCG tablet Take 1 tablet (150 mcg total) by mouth daily at 6 (six) AM.   loratadine (CLARITIN) 10 MG tablet Take 1 tablet by mouth daily.   metFORMIN (GLUCOPHAGE-XR) 500 MG 24 hr tablet Take 500 mg by mouth 2 (two) times daily.   methocarbamol (ROBAXIN) 500 MG tablet Take 1-1.5 tablets (500-750 mg total) by mouth at bedtime as needed for muscle spasms.   Prenatal Vit-Fe Fumarate-FA (PRENATAL MULTIVITAMIN) TABS tablet Take 1 tablet by mouth daily at 12 noon.   senna-docusate (SENOKOT-S) 8.6-50 MG tablet Take 2 tablets by mouth daily.   witch hazel-glycerin (TUCKS) pad Apply 1 Application topically as needed for hemorrhoids (for pain).   simethicone (MYLICON) 80 MG chewable tablet Chew 1 tablet (80 mg total) by mouth as needed for flatulence.   No facility-administered  medications prior to visit.   No Known Allergies  Immunization History  Administered Date(s) Administered   Moderna Covid-19 Fall Seasonal Vaccine 25yrs & older 11/07/2022   Tdap 07/29/2022    Health Maintenance  Topic Date Due   Pneumococcal Vaccine 31-71 Years old (1 of 2 - PCV) Never done   Hepatitis C Screening  Never done   Diabetic kidney evaluation - Urine ACR  07/29/2023   COVID-19 Vaccine (4 - 2024-25 season) 08/02/2023   Diabetic kidney evaluation - eGFR measurement  08/23/2023   Cervical Cancer Screening (HPV/Pap Cotest)  12/15/2023   Colonoscopy  Never done   DTaP/Tdap/Td (2 - Td or Tdap) 07/29/2032   INFLUENZA VACCINE  Completed   HIV Screening  Completed   HPV VACCINES  Aged Out    Patient Care Team: Debera Lat, [place]  as PCP - General (Physician Assistant)  Review of Systems  All other systems reviewed and are negative.  Except see HPI       Objective    BP 110/77   Pulse 65   Ht 5\' 4"  (1.626 m)   Wt 167 lb 4.8 oz (75.9 kg)   SpO2 99%   BMI 28.72 kg/m     Physical Exam Vitals reviewed.  Constitutional:      General: She is not in acute distress.    Appearance: Normal appearance. She is well-developed. She is not diaphoretic.  HENT:     Head: Normocephalic and atraumatic.  Eyes:     General: No scleral icterus.    Conjunctiva/sclera: Conjunctivae normal.  Neck:     Thyroid: No thyromegaly.  Cardiovascular:     Rate and Rhythm: Normal rate and regular rhythm.     Pulses: Normal pulses.     Heart sounds: Normal heart sounds. No murmur heard. Pulmonary:     Effort: Pulmonary effort is normal. No respiratory distress.     Breath sounds: Normal breath sounds. No wheezing, rhonchi or rales.  Musculoskeletal:     Cervical back: Neck supple.     Right lower leg: No edema.     Left lower leg: No edema.  Lymphadenopathy:     Cervical: No cervical adenopathy.  Skin:    General: Skin is warm and dry.     Findings: No rash.  Neurological:      Mental Status: She is alert and oriented to person, place, and time. Mental status is at baseline.  Psychiatric:        Mood and Affect: Mood normal.        Behavior: Behavior normal.     Depression Screen    01/19/2024    2:39 PM 09/19/2020    8:29 AM 08/27/2020   12:47 PM 11/18/2019   10:58 AM  PHQ 2/9 Scores  PHQ - 2 Score 2 0 0 0  PHQ- 9 Score 9         01/19/2024    2:40 PM  GAD 7 : Generalized Anxiety Score  Nervous, Anxious, on Edge 1  Control/stop worrying 0  Worry too much - different things 1  Trouble relaxing 1  Restless 0  Easily annoyed or irritable 0  Afraid - awful might happen 0  Total GAD 7 Score 3  Anxiety Difficulty Not difficult at all     No results found for any visits on 01/19/24.  Assessment & Plan     Controlled type 2 diabetes mellitus without complication, without long-term current use of insulin (HCC) (Primary) - Basic metabolic panel - TSH  Other specified hypothyroidism - T4, free  Colon cancer screening - Ambulatory referral to gastroenterology for colonoscopy     Type 2 Diabetes Mellitus chronic Controlled on Metformin 500mg  XR bid. Last A1C was 6.7 in December 2024. -Order basic metabolic panel and fasting glucose to assess current control.  Insomnia chronic Difficulty maintaining sleep. Possible contributing factors include anxiety and poor sleep hygiene. -Advise on maintaining good sleep hygiene including consistent sleep schedule, creating a quiet and comfortable sleep environment, and avoiding electronic devices before bed. -Consider evaluation for depression and anxiety/both mild by phq9 and gad7 scores as these may be contributing to insomnia.  Hypothyroidism On Levothyroxine , last thyroid function test was in December 2024. -Order thyroid function tests to assess current control.  Seasonal Allergies and Asthma Controlled on Albuterol and Symbicort. Symptoms worsen  in cold weather and during pollen  season. -Continue current medications. -Consider referral to allergist or pulmonologist for further management if symptoms worsen.  General Health Maintenance -Order lipid panel. -Refer for colonoscopy as patient is due and has agreed to the procedure. -Schedule follow-up visit in 4-6 weeks for physical examination.      Encounter to establish care Welcomed to our clinic Reviewed past medical hx, social hx, family hx and surgical hx Pt advised to send all vaccination records or screening   Return for CPE in4-6 weeks, CPE.    The patient was advised to call back or seek an in-person evaluation if the symptoms worsen or if the condition fails to improve as anticipated.  I discussed the assessment and treatment plan with the patient. The patient was provided an opportunity to ask questions and all were answered. The patient agreed with the plan and demonstrated an understanding of the instructions.  I, Debera Lat, PA-C have reviewed all documentation for this visit. The documentation on  01/19/2024   for the exam, diagnosis, procedures, and orders are all accurate and complete.  Debera Lat, [facility], MMS [facility] [phone] (phone) [phone] (fax)  [facility]

## 2024-01-26 ENCOUNTER — Other Ambulatory Visit: Payer: Self-pay

## 2024-01-26 ENCOUNTER — Telehealth: Payer: Self-pay

## 2024-01-26 DIAGNOSIS — M7711 Lateral epicondylitis, right elbow: Secondary | ICD-10-CM | POA: Insufficient documentation

## 2024-01-26 DIAGNOSIS — H524 Presbyopia: Secondary | ICD-10-CM | POA: Insufficient documentation

## 2024-01-26 DIAGNOSIS — E669 Obesity, unspecified: Secondary | ICD-10-CM | POA: Insufficient documentation

## 2024-01-26 DIAGNOSIS — N390 Urinary tract infection, site not specified: Secondary | ICD-10-CM | POA: Insufficient documentation

## 2024-01-26 DIAGNOSIS — H04129 Dry eye syndrome of unspecified lacrimal gland: Secondary | ICD-10-CM | POA: Insufficient documentation

## 2024-01-26 DIAGNOSIS — Z658 Other specified problems related to psychosocial circumstances: Secondary | ICD-10-CM | POA: Insufficient documentation

## 2024-01-26 DIAGNOSIS — M25521 Pain in right elbow: Secondary | ICD-10-CM | POA: Insufficient documentation

## 2024-01-26 DIAGNOSIS — Z1211 Encounter for screening for malignant neoplasm of colon: Secondary | ICD-10-CM

## 2024-01-26 DIAGNOSIS — Q809 Congenital ichthyosis, unspecified: Secondary | ICD-10-CM | POA: Insufficient documentation

## 2024-01-26 DIAGNOSIS — H5200 Hypermetropia, unspecified eye: Secondary | ICD-10-CM | POA: Insufficient documentation

## 2024-01-26 DIAGNOSIS — I209 Angina pectoris, unspecified: Secondary | ICD-10-CM | POA: Insufficient documentation

## 2024-01-26 DIAGNOSIS — E119 Type 2 diabetes mellitus without complications: Secondary | ICD-10-CM | POA: Insufficient documentation

## 2024-01-26 DIAGNOSIS — M722 Plantar fascial fibromatosis: Secondary | ICD-10-CM | POA: Insufficient documentation

## 2024-01-26 DIAGNOSIS — J45909 Unspecified asthma, uncomplicated: Secondary | ICD-10-CM | POA: Insufficient documentation

## 2024-01-26 DIAGNOSIS — R609 Edema, unspecified: Secondary | ICD-10-CM | POA: Insufficient documentation

## 2024-01-26 DIAGNOSIS — E559 Vitamin D deficiency, unspecified: Secondary | ICD-10-CM | POA: Insufficient documentation

## 2024-01-26 DIAGNOSIS — G039 Meningitis, unspecified: Secondary | ICD-10-CM | POA: Insufficient documentation

## 2024-01-26 DIAGNOSIS — J309 Allergic rhinitis, unspecified: Secondary | ICD-10-CM | POA: Insufficient documentation

## 2024-01-26 DIAGNOSIS — G4700 Insomnia, unspecified: Secondary | ICD-10-CM | POA: Insufficient documentation

## 2024-01-26 DIAGNOSIS — E0500 Thyrotoxicosis with diffuse goiter without thyrotoxic crisis or storm: Secondary | ICD-10-CM | POA: Insufficient documentation

## 2024-01-26 DIAGNOSIS — H547 Unspecified visual loss: Secondary | ICD-10-CM | POA: Insufficient documentation

## 2024-01-26 DIAGNOSIS — M542 Cervicalgia: Secondary | ICD-10-CM | POA: Insufficient documentation

## 2024-01-26 DIAGNOSIS — G4489 Other headache syndrome: Secondary | ICD-10-CM | POA: Insufficient documentation

## 2024-01-26 MED ORDER — NA SULFATE-K SULFATE-MG SULF 17.5-3.13-1.6 GM/177ML PO SOLN: 1.0000 | Freq: Once | ORAL | 0 refills | Status: AC

## 2024-01-26 NOTE — Telephone Encounter (Signed)
 Gastroenterology Pre-Procedure Review  Request Date: 03/25/24 Requesting Physician: Dr. Tobi Bastos  PATIENT REVIEW QUESTIONS: The patient responded to the following health history questions as indicated:    1. Are you having any GI issues? no 2. Do you have a personal history of Polyps? no 3. Do you have a family history of Colon Cancer or Polyps? no 4. Diabetes Mellitus? yes (has been advised to stop empaglifozin-metformin 2 days prior to colonoscopy) 5. Joint replacements in the past 12 months?no 6. Major health problems in the past 3 months?no 7. Any artificial heart valves, MVP, or defibrillator?no    MEDICATIONS & ALLERGIES:    Patient reports the following regarding taking any anticoagulation/antiplatelet therapy:   Plavix, Coumadin, Eliquis, Xarelto, Lovenox, Pradaxa, Brilinta, or Effient? no Aspirin? no  Patient confirms/reports the following medications:  Current Outpatient Medications  Medication Sig Dispense Refill   acetaminophen (TYLENOL) 325 MG tablet Take 2 tablets (650 mg total) by mouth every 6 (six) hours as needed (for pain scale < 4).     Albuterol Sulfate 108 (90 Base) MCG/ACT AEPB Inhale 2 puffs into the lungs every 6 (six) hours as needed.     benzocaine-Menthol (DERMOPLAST) 20-0.5 % AERO Apply 1 Application topically as needed for irritation (perineal discomfort).     budesonide-formoterol (SYMBICORT) 80-4.5 MCG/ACT inhaler INHALE 2 PUFFS BY MOUTH DAILY (RINSE MOUTH WELL WITH WATER AFTER EACH USE) ASTHMA CONTROLLER MEDICATION     carboxymethylcellulose (REFRESH PLUS) 0.5 % SOLN Apply 1 drop to eye as needed.     cetirizine (ZYRTEC) 10 MG tablet Take 10 mg by mouth daily.      fluticasone (FLONASE) 50 MCG/ACT nasal spray Place 1 spray into both nostrils daily.     ibuprofen (ADVIL) 600 MG tablet Take 1 tablet (600 mg total) by mouth every 6 (six) hours. 30 tablet 0   ibuprofen (ADVIL) 800 MG tablet Take 800 mg by mouth every 8 (eight) hours as needed.      levothyroxine (SYNTHROID) 150 MCG tablet Take 1 tablet (150 mcg total) by mouth daily at 6 (six) AM. 30 tablet 1   loratadine (CLARITIN) 10 MG tablet Take 1 tablet by mouth daily.     metFORMIN (GLUCOPHAGE-XR) 500 MG 24 hr tablet Take 500 mg by mouth 2 (two) times daily.     methocarbamol (ROBAXIN) 500 MG tablet Take 1-1.5 tablets (500-750 mg total) by mouth at bedtime as needed for muscle spasms. 90 tablet 0   Prenatal Vit-Fe Fumarate-FA (PRENATAL MULTIVITAMIN) TABS tablet Take 1 tablet by mouth daily at 12 noon.     senna-docusate (SENOKOT-S) 8.6-50 MG tablet Take 2 tablets by mouth daily.     witch hazel-glycerin (TUCKS) pad Apply 1 Application topically as needed for hemorrhoids (for pain). 40 each 12   No current facility-administered medications for this visit.    Patient confirms/reports the following allergies:  No Known Allergies  No orders of the defined types were placed in this encounter.   AUTHORIZATION INFORMATION Primary Insurance: 1D#: Group #:  Secondary Insurance: 1D#: Group #:  SCHEDULE INFORMATION: Date:  Time: Location:

## 2024-02-16 ENCOUNTER — Ambulatory Visit: Payer: No Typology Code available for payment source | Admitting: Physician Assistant

## 2024-03-25 ENCOUNTER — Other Ambulatory Visit: Payer: Self-pay

## 2024-03-25 ENCOUNTER — Ambulatory Visit: Payer: Self-pay | Admitting: Anesthesiology

## 2024-03-25 ENCOUNTER — Encounter: Payer: Self-pay | Admitting: Gastroenterology

## 2024-03-25 ENCOUNTER — Ambulatory Visit
Admission: RE | Admit: 2024-03-25 | Discharge: 2024-03-25 | Disposition: A | Discharge status: Home / Self Care | Payer: No Typology Code available for payment source | Attending: Gastroenterology | Admitting: Gastroenterology

## 2024-03-25 ENCOUNTER — Encounter
Admission: RE | Disposition: A | Discharge status: Home / Self Care | Payer: Self-pay | Source: Home / Self Care | Attending: Gastroenterology

## 2024-03-25 DIAGNOSIS — E119 Type 2 diabetes mellitus without complications: Secondary | ICD-10-CM | POA: Insufficient documentation

## 2024-03-25 DIAGNOSIS — Z79890 Hormone replacement therapy (postmenopausal): Secondary | ICD-10-CM | POA: Insufficient documentation

## 2024-03-25 DIAGNOSIS — E039 Hypothyroidism, unspecified: Secondary | ICD-10-CM | POA: Insufficient documentation

## 2024-03-25 DIAGNOSIS — J45909 Unspecified asthma, uncomplicated: Secondary | ICD-10-CM | POA: Insufficient documentation

## 2024-03-25 DIAGNOSIS — Z7951 Long term (current) use of inhaled steroids: Secondary | ICD-10-CM | POA: Insufficient documentation

## 2024-03-25 DIAGNOSIS — Z7984 Long term (current) use of oral hypoglycemic drugs: Secondary | ICD-10-CM | POA: Insufficient documentation

## 2024-03-25 DIAGNOSIS — Z1211 Encounter for screening for malignant neoplasm of colon: Secondary | ICD-10-CM

## 2024-03-25 HISTORY — PX: COLONOSCOPY WITH PROPOFOL: SHX5780

## 2024-03-25 LAB — POCT PREGNANCY, URINE: Preg Test, Ur: NEGATIVE

## 2024-03-25 LAB — GLUCOSE, CAPILLARY: Glucose-Capillary: 81 mg/dL (ref 70–99)

## 2024-03-25 SURGERY — COLONOSCOPY WITH PROPOFOL
Anesthesia: General

## 2024-03-25 MED ORDER — PROPOFOL 10 MG/ML IV BOLUS
INTRAVENOUS | Status: DC | PRN
  Administered 2024-03-25: 50 mg via INTRAVENOUS
  Administered 2024-03-25: 130 ug/kg/min via INTRAVENOUS

## 2024-03-25 MED ORDER — SODIUM CHLORIDE 0.9 % IV SOLN: INTRAVENOUS | Status: DC

## 2024-03-25 MED ORDER — DEXMEDETOMIDINE HCL IN NACL 400 MCG/100ML IV SOLN
INTRAVENOUS | Status: DC | PRN
  Administered 2024-03-25: 4 ug via INTRAVENOUS

## 2024-03-25 MED ORDER — LIDOCAINE HCL (PF) 2 % IJ SOLN
INTRAMUSCULAR | Status: AC
  Filled 2024-03-25: qty 5

## 2024-03-25 MED ORDER — GLYCOPYRROLATE 0.2 MG/ML IJ SOLN
INTRAMUSCULAR | Status: DC | PRN
  Administered 2024-03-25: .2 mg via INTRAVENOUS

## 2024-03-25 MED ORDER — LIDOCAINE HCL (CARDIAC) PF 100 MG/5ML IV SOSY
PREFILLED_SYRINGE | INTRAVENOUS | Status: DC | PRN
  Administered 2024-03-25: 50 mg via INTRAVENOUS

## 2024-03-25 MED ORDER — PROPOFOL 10 MG/ML IV BOLUS
INTRAVENOUS | Status: AC
  Filled 2024-03-25: qty 40

## 2024-03-25 NOTE — H&P (Signed)
 [name], MD [address], [place], [place], [address] [address], [address], [place], [place], [address] Phone: [phone]  Fax: [phone]  Primary Care Physician:  Blane Bunting, PA-C   Pre-Procedure History & Physical: HPI:  Jessica Knight is a 43 y.o. female is here for an colonoscopy.   Past Medical History:  Diagnosis Date   Asthma    Diabetes mellitus without complication (HCC)    Thyroid disease     Past Surgical History:  Procedure Laterality Date   BREAST SURGERY     HERNIA REPAIR     LASIK      Prior to Admission medications   Medication Sig Start Date End Date Taking? Authorizing Provider  levothyroxine  (SYNTHROID ) 150 MCG tablet Take 1 tablet (150 mcg total) by mouth daily at 6 (six) AM. 07/31/22  Yes Wilson, Prudy Brownie, CNM  acetaminophen  (TYLENOL ) 325 MG tablet Take 2 tablets (650 mg total) by mouth every 6 (six) hours as needed (for pain scale < 4). 07/30/22   Auston Left, CNM  Albuterol Sulfate 108 (90 Base) MCG/ACT AEPB Inhale 2 puffs into the lungs every 6 (six) hours as needed. 05/23/19   [provider]  benzocaine -Menthol  (DERMOPLAST) 20-0.5 % AERO Apply 1 Application topically as needed for irritation (perineal discomfort). 07/30/22   Wilson, Danielle Renee, CNM  budesonide-formoterol (SYMBICORT) 80-4.5 MCG/ACT inhaler INHALE 2 PUFFS BY MOUTH DAILY (RINSE MOUTH WELL WITH WATER AFTER EACH USE) ASTHMA CONTROLLER MEDICATION 06/19/20   [provider]  carboxymethylcellulose (REFRESH PLUS) 0.5 % SOLN Apply 1 drop to eye as needed. 12/27/20   [provider]  cetirizine (ZYRTEC) 10 MG tablet Take 10 mg by mouth daily.  08/12/19   [provider]  fluticasone (FLONASE) 50 MCG/ACT nasal spray Place 1 spray into both nostrils daily. 02/15/19   [provider]  glucose blood (PRECISION QID TEST) test strip 1 each (1 strip total) 4 (four) times daily Use as instructed. 06/02/22   [provider]  ibuprofen  (ADVIL ) 600 MG tablet Take 1 tablet (600 mg total) by mouth every 6 (six) hours. 07/30/22   Auston Left, CNM  ibuprofen  (ADVIL ) 800 MG tablet Take 800 mg by mouth every 8 (eight) hours as needed.    [provider]  loratadine (CLARITIN) 10 MG tablet Take 1 tablet by mouth daily. 03/15/21   [provider]  metFORMIN (GLUCOPHAGE-XR) 500 MG 24 hr tablet Take 500 mg by mouth 2 (two) times daily. 10/17/23   [provider]  methocarbamol  (ROBAXIN ) 500 MG tablet Take 1-1.5 tablets (500-750 mg total) by mouth at bedtime as needed for muscle spasms. 12/15/22   Cephus Collin, MD  NEEDLE, DISP, 25 G (BD HYPODERMIC NEEDLE) 25G X 1-1/2" MISC Use to Inject progesterone 10/07/21   [provider]  Prenatal Vit-Fe Fumarate-FA (PRENATAL MULTIVITAMIN) TABS tablet Take 1 tablet by mouth daily at 12 noon. 07/30/22   Auston Left, CNM  senna-docusate (SENOKOT-S) 8.6-50 MG tablet Take 2 tablets by mouth daily. 07/30/22   Auston Left, CNM  witch hazel-glycerin  (TUCKS) pad Apply 1 Application topically as needed for hemorrhoids (for pain). 07/30/22   Auston Left, CNM    Allergies as of 01/26/2024   (No Known Allergies)    Family History  Problem Relation Age of Onset   Breast cancer Maternal Aunt    Diabetes Maternal Grandmother     Social History   Socioeconomic History   Marital status:  Married    Spouse name: emmanuel   Number of children: Not on file   Years of education: Not on file   Highest education level: Master's degree (e.g., MA, MS, MEng, MEd, MSW, MBA)  Occupational History   Not on file  Tobacco Use   Smoking status: Never   Smokeless tobacco: Never  Substance and Sexual Activity   Alcohol use: Not Currently    Comment: occas   Drug use: Not Currently   Sexual activity: Yes    Birth control/protection: None  Other Topics Concern   Not on file  Social History Narrative   Not on file    Social Drivers of Health   Financial Resource Strain: Low Risk  (01/18/2024)   Overall Financial Resource Strain (CARDIA)    Difficulty of Paying Living Expenses: Not hard at all  Food Insecurity: No Food Insecurity (01/18/2024)   Hunger Vital Sign    Worried About Running Out of Food in the Last Year: Never true    Ran Out of Food in the Last Year: Never true  Transportation Needs: No Transportation Needs (01/18/2024)   PRAPARE - Administrator, Civil Service (Medical): No    Lack of Transportation (Non-Medical): No  Physical Activity: Insufficiently Active (01/18/2024)   Exercise Vital Sign    Days of Exercise per Week: 3 days    Minutes of Exercise per Session: 30 min  Stress: No Stress Concern Present (01/18/2024)   [employer] of Occupational Health - Occupational Stress Questionnaire    Feeling of Stress : Only a little  Social Connections: Moderately Integrated (01/18/2024)   Social Connection and Isolation Panel [NHANES]    Frequency of Communication with Friends and Family: More than three times a week    Frequency of Social Gatherings with Friends and Family: More than three times a week    Attends Religious Services: More than 4 times per year    Active Member of [employer] or Organizations: Yes    Attends Banker Meetings: More than 4 times per year    Marital Status: Divorced  Intimate Partner Violence: Unknown (03/06/2022)   Received from [employer], [facility]   HITS    Physically Hurt: Not on file    Insult or Talk Down To: Not on file    Threaten Physical Harm: Not on file    Scream or Curse: Not on file    Review of Systems: See HPI, otherwise negative ROS  Physical Exam: There were no vitals taken for this visit. General:   Alert,  pleasant and cooperative in NAD Head:  Normocephalic and atraumatic. Neck:  Supple; no masses or thyromegaly. Lungs:  Clear throughout to auscultation, normal respiratory effort.    Heart:  +S1,  +S2, Regular rate and rhythm, No edema. Abdomen:  Soft, nontender and nondistended. Normal bowel sounds, without guarding, and without rebound.   Neurologic:  Alert and  oriented x4;  grossly normal neurologically.  Impression/Plan: Jessica Knight is here for an colonoscopy to be performed for Screening colonoscopy average risk   Risks, benefits, limitations, and alternatives regarding  colonoscopy have been reviewed with the patient.  Questions have been answered.  All parties agreeable.   [name], MD  03/25/2024, 7:50 AM

## 2024-03-25 NOTE — Op Note (Signed)
 [facility] Gastroenterology Patient Name: Jessica Knight Procedure Date: 03/25/2024 8:22 AM MRN: [id number] Account #: [id number] Date of Birth: Sep 14, 1981 Admit Type: Outpatient Age: 43 Room: [facility] ENDO ROOM 4 Gender: Female Note Status: Finalized Instrument Name: Charlyn Cooley [id number] Procedure:             Colonoscopy Indications:           Screening for colorectal malignant neoplasm Providers:             [name] MD, MD Referring MD:          Blane Bunting (Referring MD) Medicines:             Monitored Anesthesia Care Complications:         No immediate complications. Procedure:             Pre-Anesthesia Assessment:                        - Prior to the procedure, a History and Physical was                         performed, and patient medications, allergies and                         sensitivities were reviewed. The patient's tolerance                         of previous anesthesia was reviewed.                        - The risks and benefits of the procedure and the                         sedation options and risks were discussed with the                         patient. All questions were answered and informed                         consent was obtained.                        - ASA Grade Assessment: II - A patient with mild                         systemic disease.                        After obtaining informed consent, the colonoscope was                         passed under direct vision. Throughout the procedure,                         the patient's blood pressure, pulse, and oxygen                         saturations were monitored continuously. The                         Colonoscope was introduced through  the anus and                         advanced to the the cecum, identified by the                         appendiceal orifice. The colonoscopy was performed                         without difficulty. The patient tolerated the                          procedure well. The quality of the bowel preparation                         was adequate. The ileocecal valve, appendiceal                         orifice, and rectum were photographed. Findings:      The entire examined colon appeared normal on direct and retroflexion       views. Impression:            - The entire examined colon is normal on direct and                         retroflexion views.                        - No specimens collected. Recommendation:        - Discharge patient to home (with escort).                        - Resume previous diet.                        - Continue present medications.                        - Repeat colonoscopy in 10 years for screening                         purposes. Procedure Code(s):     --- Professional ---                        [phone], Colonoscopy, flexible; diagnostic, including                         collection of specimen(s) by brushing or washing, when                         performed (separate procedure) Diagnosis Code(s):     --- Professional ---                        Z12.11, Encounter for screening for malignant neoplasm                         of colon CPT copyright 2022 American Medical Association. All rights reserved. The codes documented in this report are preliminary and upon coder review may  be revised to meet current compliance  requirements. [name], MD [name] MD, MD 03/25/2024 8:44:44 AM This report has been signed electronically. Number of Addenda: 0 Note Initiated On: 03/25/2024 8:22 AM Scope Withdrawal Time: 0 hours 10 minutes 1 second  Total Procedure Duration: 0 hours 14 minutes 46 seconds  Estimated Blood Loss:  Estimated blood loss: none.      [facility]

## 2024-03-25 NOTE — Transfer of Care (Signed)
 Immediate Anesthesia Transfer of Care Note  Patient: Jessica Knight  Procedure(s) Performed: COLONOSCOPY WITH PROPOFOL   Patient Location: PACU  Anesthesia Type:MAC  Level of Consciousness: awake and alert   Airway & Oxygen Therapy: Patient Spontanous Breathing  Post-op Assessment: Report given to RN and Post -op Vital signs reviewed and stable  Post vital signs: stable  Last Vitals:  Vitals Value Taken Time  BP    Temp    Pulse    Resp    SpO2      Last Pain:  Vitals:   03/25/24 0809  TempSrc: Tympanic         Complications: No notable events documented.

## 2024-03-25 NOTE — Anesthesia Postprocedure Evaluation (Signed)
 Anesthesia Post Note  Patient: Horticulturist, commercial  Procedure(s) Performed: COLONOSCOPY WITH PROPOFOL   Patient location during evaluation: PACU Anesthesia Type: General Level of consciousness: awake and alert, oriented and patient cooperative Pain management: pain level controlled Vital Signs Assessment: post-procedure vital signs reviewed and stable Respiratory status: spontaneous breathing, nonlabored ventilation and respiratory function stable Cardiovascular status: blood pressure returned to baseline and stable Postop Assessment: adequate PO intake Anesthetic complications: no   No notable events documented.   Last Vitals:  Vitals:   03/25/24 0850 03/25/24 0900  BP: 101/73 112/78  Pulse: (!) 52 64  Resp: 12 18  Temp: (!) 36 C   SpO2: 100% 100%    Last Pain:  Vitals:   03/25/24 0850  TempSrc: Tympanic                 Mekiah Wahler

## 2024-03-25 NOTE — Anesthesia Preprocedure Evaluation (Addendum)
 Anesthesia Evaluation  Patient identified by MRN, date of birth, ID band Patient awake    Reviewed: Allergy & Precautions, NPO status , Patient's Chart, lab work & pertinent test results  History of Anesthesia Complications Negative for: history of anesthetic complications  Airway Mallampati: I   Neck ROM: Full    Dental no notable dental hx.    Pulmonary asthma    Pulmonary exam normal breath sounds clear to auscultation       Cardiovascular Exercise Tolerance: Good Normal cardiovascular exam Rhythm:Regular Rate:Normal     Neuro/Psych negative neurological ROS     GI/Hepatic negative GI ROS,,,  Endo/Other  diabetes, Type 2Hypothyroidism    Renal/GU negative Renal ROS     Musculoskeletal   Abdominal   Peds  Hematology negative hematology ROS (+)   Anesthesia Other Findings   Reproductive/Obstetrics                             Anesthesia Physical Anesthesia Plan  ASA: 2  Anesthesia Plan: General   Post-op Pain Management:    Induction: Intravenous  PONV Risk Score and Plan: 3 and Propofol  infusion, TIVA and Treatment may vary due to age or medical condition  Airway Management Planned: Natural Airway  Additional Equipment:   Intra-op Plan:   Post-operative Plan:   Informed Consent: I have reviewed the patients History and Physical, chart, labs and discussed the procedure including the risks, benefits and alternatives for the proposed anesthesia with the patient or authorized representative who has indicated his/her understanding and acceptance.       Plan Discussed with: CRNA  Anesthesia Plan Comments: (LMA/GETA backup discussed.  Patient consented for risks of anesthesia including but not limited to:  - adverse reactions to medications - damage to eyes, teeth, lips or other oral mucosa - nerve damage due to positioning  - sore throat or hoarseness - damage to  heart, brain, nerves, lungs, other parts of body or loss of life  Informed patient about role of CRNA in peri- and intra-operative care.  Patient voiced understanding.)        Anesthesia Quick Evaluation

## 2024-03-26 ENCOUNTER — Encounter: Payer: Self-pay | Admitting: Gastroenterology

## 2024-08-30 ENCOUNTER — Other Ambulatory Visit: Payer: Self-pay

## 2024-08-30 ENCOUNTER — Emergency Department
Admission: EM | Admit: 2024-08-30 | Discharge: 2024-08-30 | Disposition: A | Discharge status: Home / Self Care | Source: Intra-hospital

## 2024-08-30 ENCOUNTER — Emergency Department

## 2024-08-30 DIAGNOSIS — R0789 Other chest pain: Secondary | ICD-10-CM | POA: Diagnosis not present

## 2024-08-30 DIAGNOSIS — R079 Chest pain, unspecified: Secondary | ICD-10-CM

## 2024-08-30 DIAGNOSIS — E119 Type 2 diabetes mellitus without complications: Secondary | ICD-10-CM | POA: Diagnosis not present

## 2024-08-30 DIAGNOSIS — J45909 Unspecified asthma, uncomplicated: Secondary | ICD-10-CM | POA: Insufficient documentation

## 2024-08-30 LAB — BASIC METABOLIC PANEL WITH GFR
Anion gap: 13 (ref 5–15)
BUN: 8 mg/dL (ref 6–20)
CO2: 23 mmol/L (ref 22–32)
Calcium: 9.4 mg/dL (ref 8.9–10.3)
Chloride: 103 mmol/L (ref 98–111)
Creatinine, Ser: 0.82 mg/dL (ref 0.44–1.00)
GFR, Estimated: >60 mL/min (ref >60)
Glucose, Bld: 93 mg/dL (ref 70–99)
Potassium: 4 mmol/L (ref 3.5–5.1)
Sodium: 139 mmol/L (ref 135–145)

## 2024-08-30 LAB — HEPATIC FUNCTION PANEL
ALT: 14 U/L (ref 0–44)
AST: 22 U/L (ref 15–41)
Albumin: 4 g/dL (ref 3.5–5.0)
Alkaline Phosphatase: 89 U/L (ref 38–126)
Bilirubin, Direct: 0.1 mg/dL (ref 0.0–0.2)
Indirect Bilirubin: 0.1 mg/dL — ABNORMAL LOW (ref 0.3–0.9)
Total Bilirubin: 0.2 mg/dL (ref 0.0–1.2)
Total Protein: 7.2 g/dL (ref 6.5–8.1)

## 2024-08-30 LAB — D-DIMER, QUANTITATIVE: D-Dimer, Quant: 0.28 ug{FEU}/mL (ref 0.00–0.50)

## 2024-08-30 LAB — CBC
HCT: 44 % (ref 36.0–46.0)
Hemoglobin: 13.6 g/dL (ref 12.0–15.0)
MCH: 26.6 pg (ref 26.0–34.0)
MCHC: 30.9 g/dL (ref 30.0–36.0)
MCV: 86.1 fL (ref 80.0–100.0)
Platelets: 261 K/uL (ref 150–400)
RBC: 5.11 MIL/uL (ref 3.87–5.11)
RDW: 13.7 % (ref 11.5–15.5)
WBC: 4.7 K/uL (ref 4.0–10.5)
nRBC: 0 % (ref 0.0–0.2)

## 2024-08-30 LAB — TROPONIN I (HIGH SENSITIVITY): Troponin I (High Sensitivity): <2 ng/L (ref <18)

## 2024-08-30 LAB — HCG, QUANTITATIVE, PREGNANCY: hCG, Beta Chain, Quant, S: 2 m[IU]/mL (ref <5)

## 2024-08-30 MED ORDER — ALUM & MAG HYDROXIDE-SIMETH 200-200-20 MG/5ML PO SUSP
30.0000 mL | Freq: Once | ORAL | Status: AC
  Administered 2024-08-30: 30 mL via ORAL
  Filled 2024-08-30: qty 30

## 2024-08-30 MED ORDER — FAMOTIDINE 20 MG PO TABS: 20.0000 mg | ORAL_TABLET | Freq: Two times a day (BID) | ORAL | 0 refills | Status: AC

## 2024-08-30 MED ORDER — LIDOCAINE 5 % EX PTCH
1.0000 | MEDICATED_PATCH | CUTANEOUS | Status: DC
  Administered 2024-08-30: 1 via TRANSDERMAL
  Filled 2024-08-30: qty 1

## 2024-08-30 MED ORDER — LIDOCAINE VISCOUS HCL 2 % MT SOLN
15.0000 mL | Freq: Once | OROMUCOSAL | Status: AC
  Administered 2024-08-30: 15 mL via ORAL
  Filled 2024-08-30: qty 15

## 2024-08-30 MED ORDER — FAMOTIDINE 20 MG PO TABS
20.0000 mg | ORAL_TABLET | Freq: Once | ORAL | Status: AC
  Administered 2024-08-30: 20 mg via ORAL
  Filled 2024-08-30: qty 1

## 2024-08-30 MED ORDER — ACETAMINOPHEN 500 MG PO TABS
1000.0000 mg | ORAL_TABLET | Freq: Once | ORAL | Status: AC
  Administered 2024-08-30: 1000 mg via ORAL
  Filled 2024-08-30: qty 2

## 2024-08-30 MED ORDER — ALUMINUM-MAGNESIUM-SIMETHICONE 200-200-20 MG/5ML PO SUSP: 30.0000 mL | Freq: Three times a day (TID) | ORAL | 0 refills | Status: AC | PRN

## 2024-08-30 NOTE — Discharge Instructions (Addendum)
 Your evaluation in the emergency department was overall reassuring, and your symptoms improved with treatment.  I suspect you likely have a strain of the muscles of your chest wall, and you can continue to use Tylenol , Motrin , and Lidoderm  patches as needed for any ongoing discomfort.  I have also prescribed you 2 antacid medications that you can use in case irritation of your esophagus and/or stomach may be contributing to your symptoms as well.  Please follow-up with your primary care provider for reevaluation, and return to the emergency department with any new or worsening symptoms.

## 2024-08-30 NOTE — ED Triage Notes (Signed)
 Pt comes in via POV with complaints of chest pain that  started about 3-4 days ago. Pt states that's she has been taking tylenol  and ibuprofen  with no relief. When she woke up this morning, she felt the sensation of a weight on her chest. PT complaint of pain 6/10 at this time. Pt with no signs of acute distress at this time.

## 2024-08-30 NOTE — ED Provider Notes (Signed)
 [facility] Provider Note    Event Date/Time   First MD Initiated Contact with Patient 08/30/24 1224     (approximate)   History   Chest Pain  Pt comes in via POV with complaints of chest pain that  started about 3-4 days ago. Pt states that's she has been taking tylenol  and ibuprofen  with no relief. When she woke up this morning, she felt the sensation of a weight on her chest. PT complaint of pain 6/10 at this time. Pt with no signs of acute distress at this time.    HPI Jessica Knight is a 43 y.o. female PMH T2DM, iron  deficiency anemia, asthma presents for evaluation of chest pain - Sternal, 3 to 4 days, constant, pressure.  Notices worsened pain with movement of upper extremities or touching of mid chest.  Pleuritic component as well.  No leg swelling.  No history of DVT/PE, no recent surgery/station/travel, no hormone use. - Pain is refractory to Tylenol , Motrin  - No vomiting, no black or bloody stools - No preceding trauma - No clear preceding strain though does frequently pick up her young children       Physical Exam   Triage Vital Signs: ED Triage Vitals  Encounter Vitals Group     BP 08/30/24 1145 (!) 136/90     Girls Systolic BP Percentile --      Girls Diastolic BP Percentile --      Boys Systolic BP Percentile --      Boys Diastolic BP Percentile --      Pulse Rate 08/30/24 1145 (!) 54     Resp 08/30/24 1145 17     Temp 08/30/24 1145 98.1 F (36.7 C)     Temp src --      SpO2 08/30/24 1145 100 %     Weight 08/30/24 1143 164 lb 8 oz (74.6 kg)     Height 08/30/24 1143 5' 4 (1.626 m)     Head Circumference --      Peak Flow --      Pain Score 08/30/24 1143 6     Pain Loc --      Pain Education --      Exclude from Growth Chart --     Most recent vital signs: Vitals:   08/30/24 1145  BP: (!) 136/90  Pulse: (!) 54  Resp: 17  Temp: 98.1 F (36.7 C)  SpO2: 100%     General: Awake, no distress.  CV:  Good peripheral  perfusion. RRR, RP 2+ Chest:   No skin changes overlying area of pain, notably tender to palpation over sternum, no crepitus. Resp:  Normal effort. CTAB Abd:  No distention.  Very mild tenderness to deep palpation in epigastrium/right upper quadrant only    ED Results / Procedures / Treatments   Labs (all labs ordered are listed, but only abnormal results are displayed) Labs Reviewed  HEPATIC FUNCTION PANEL - Abnormal; Notable for the following components:      Result Value   Indirect Bilirubin 0.1 (*)    All other components within normal limits  BASIC METABOLIC PANEL WITH GFR  CBC  HCG, QUANTITATIVE, PREGNANCY  D-DIMER, QUANTITATIVE  POC URINE PREG, ED  TROPONIN I (HIGH SENSITIVITY)  TROPONIN I (HIGH SENSITIVITY)     EKG  See ED course below   RADIOLOGY Radiology interpreted by myself and radiology report reviewed.    PROCEDURES:  Critical Care performed: No  Procedures   MEDICATIONS ORDERED IN  ED: Medications  lidocaine  (LIDODERM ) 5 % 1 patch (1 patch Transdermal Patch Applied 08/30/24 1301)  acetaminophen  (TYLENOL ) tablet 1,000 mg (1,000 mg Oral Given 08/30/24 1301)  alum & mag hydroxide-simeth (MAALOX/MYLANTA) 200-200-20 MG/5ML suspension 30 mL (30 mLs Oral Given 08/30/24 1301)    And  lidocaine  (XYLOCAINE ) 2 % viscous mouth solution 15 mL (15 mLs Oral Given 08/30/24 1301)  famotidine  (PEPCID ) tablet 20 mg (20 mg Oral Given 08/30/24 1301)     IMPRESSION / MDM / ASSESSMENT AND PLAN / ED COURSE  I reviewed the triage vital signs and the nursing notes.                              DDX/MDM/AP: Differential diagnosis includes, but is not limited to, likely MSK strain, do not suspect sternal fracture, ACS, doubt pneumothorax, considered but doubt PE, consider dyspepsia/GERD contributing, consider but doubt hepatobiliary pathology with radiating pain.  Plan: - Labs - Tylenol , GI cocktail - Chest x-ray - EKG - Reassess  Patient's presentation is most  consistent with acute presentation with potential threat to life or bodily function.  The patient is on the cardiac monitor to evaluate for evidence of arrhythmia and/or significant heart rate changes.  ED course below.  Workup unremarkable including troponin, D-dimer.  EKG overall stable from prior.  Feeling better after GI cocktail and Lidoderm  patch.  Presentation most consistent with MSK etiology though also considered dyspepsia/GERD contributing, doubt cardiac etiology at this time given duration of symptoms troponin with exam highly suggestive of other etiology.  Discharged home with plan for PMD follow-up.  Rx Maalox, famotidine .  Already has Tylenol , Motrin , Lidoderm  patches.  ED return precautions in place.  Patient agrees with plan.  Clinical Course as of 08/30/24 1428  Tue Aug 30, 2024  1232 Cbc wnl [MM]  1232 Ecg = sinus bradycardia, rate 54, no gross ST elevation or depression, some diffuse T wave inversions throughout inferior and precordial leads, normal axis first-degree AV block, otherwise normal intervals.  EKGs from 2023 in 2020 reviewed, intermittently similar. [MM]  1234 Chest x-ray interpreted by myself and radiology report reviewed.  No acute pathology identified. [MM]  1252 Trop wnl Symptoms for 3 days, constant, no indication for repeat [MM]  1318 Lfts wnl [MM]  1421 Dimer wnl [MM]  1421 Patient reevaluated, feeling much better after medications.  Reassured by unremarkable workup.  Overall suspect MSK strain.  Consider some component of GERD/dyspepsia also contributing.  Already has Tylenol  and Motrin  as well as Lidoderm  patches at home.  Amenable to trial of famotidine  and Maalox with plan for PMD follow-up.  Declines referral to cardiology is very reasonable, highly doubt cardiac etiology at this time. [MM]    Clinical Course User Index [MM] Clarine Ozell LABOR, MD     FINAL CLINICAL IMPRESSION(S) / ED DIAGNOSES   Final diagnoses:  Nonspecific chest pain   Chest wall pain     Rx / DC Orders   ED Discharge Orders          Ordered    aluminum-magnesium  hydroxide-simethicone  (MAALOX) 200-200-20 MG/5ML SUSP  3 times daily PRN        08/30/24 1425    famotidine  (PEPCID ) 20 MG tablet  2 times daily        08/30/24 1425             Note:  This document was prepared using Dragon voice recognition software and may include  unintentional dictation errors.   Clarine Ozell LABOR, MD 08/30/24 [phone]
